# Patient Record
Sex: Female | Born: 1990 | Race: White | Hispanic: No | Marital: Married | Smoking: Former smoker
Health system: Southern US, Community
[De-identification: ages and names within clinical notes are randomized; demographics above are authoritative.]

## PROBLEM LIST (undated history)

## (undated) DIAGNOSIS — F419 Anxiety disorder, unspecified: Secondary | ICD-10-CM

## (undated) DIAGNOSIS — J45909 Unspecified asthma, uncomplicated: Secondary | ICD-10-CM

## (undated) DIAGNOSIS — T7840XA Allergy, unspecified, initial encounter: Secondary | ICD-10-CM

## (undated) HISTORY — PX: TONSILLECTOMY: SUR1361

## (undated) HISTORY — DX: Anxiety disorder, unspecified: F41.9

## (undated) HISTORY — PX: OTHER SURGICAL HISTORY: SHX169

## (undated) HISTORY — DX: Allergy, unspecified, initial encounter: T78.40XA

## (undated) HISTORY — PX: TYMPANOSTOMY TUBE PLACEMENT: SHX32

## (undated) HISTORY — DX: Unspecified asthma, uncomplicated: J45.909

---
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
## 2017-07-01 ENCOUNTER — Emergency Department
Admission: EM | Admit: 2017-07-01 | Discharge: 2017-07-01 | Disposition: A | Discharge status: Home / Self Care | Payer: BLUE CROSS/BLUE SHIELD | Attending: Emergency Medicine | Admitting: Emergency Medicine

## 2017-07-01 ENCOUNTER — Emergency Department: Payer: BLUE CROSS/BLUE SHIELD

## 2017-07-01 ENCOUNTER — Other Ambulatory Visit: Payer: Self-pay

## 2017-07-01 DIAGNOSIS — F17200 Nicotine dependence, unspecified, uncomplicated: Secondary | ICD-10-CM | POA: Diagnosis not present

## 2017-07-01 DIAGNOSIS — R109 Unspecified abdominal pain: Secondary | ICD-10-CM

## 2017-07-01 DIAGNOSIS — R1011 Right upper quadrant pain: Secondary | ICD-10-CM | POA: Insufficient documentation

## 2017-07-01 LAB — URINALYSIS, COMPLETE (UACMP) WITH MICROSCOPIC
Bacteria, UA: NONE SEEN
Bilirubin Urine: NEGATIVE
GLUCOSE, UA: NEGATIVE mg/dL
Hgb urine dipstick: NEGATIVE
KETONES UR: NEGATIVE mg/dL
Leukocytes, UA: NEGATIVE
Nitrite: NEGATIVE
PROTEIN: NEGATIVE mg/dL
Specific Gravity, Urine: 1.021 (ref 1.005–1.030)
pH: 5 (ref 5.0–8.0)

## 2017-07-01 LAB — COMPREHENSIVE METABOLIC PANEL
ALBUMIN: 4.2 g/dL (ref 3.5–5.0)
ALK PHOS: 91 U/L (ref 38–126)
ALT: 26 U/L (ref 14–54)
ANION GAP: 9 (ref 5–15)
AST: 23 U/L (ref 15–41)
BUN: 13 mg/dL (ref 6–20)
CALCIUM: 9.5 mg/dL (ref 8.9–10.3)
CHLORIDE: 105 mmol/L (ref 101–111)
CO2: 25 mmol/L (ref 22–32)
CREATININE: 0.81 mg/dL (ref 0.44–1.00)
GFR calc non Af Amer: >60 mL/min (ref >60)
GLUCOSE: 113 mg/dL — AB (ref 65–99)
Potassium: 3.5 mmol/L (ref 3.5–5.1)
SODIUM: 139 mmol/L (ref 135–145)
Total Bilirubin: 0.4 mg/dL (ref 0.3–1.2)
Total Protein: 8.1 g/dL (ref 6.5–8.1)

## 2017-07-01 LAB — CBC
HCT: 43.8 % (ref 35.0–47.0)
HEMOGLOBIN: 14.8 g/dL (ref 12.0–16.0)
MCH: 27.8 pg (ref 26.0–34.0)
MCHC: 33.8 g/dL (ref 32.0–36.0)
MCV: 82.3 fL (ref 80.0–100.0)
Platelets: 442 10*3/uL — ABNORMAL HIGH (ref 150–440)
RBC: 5.32 MIL/uL — AB (ref 3.80–5.20)
RDW: 13.7 % (ref 11.5–14.5)
WBC: 13.1 10*3/uL — ABNORMAL HIGH (ref 3.6–11.0)

## 2017-07-01 LAB — POCT PREGNANCY, URINE: PREG TEST UR: NEGATIVE

## 2017-07-01 LAB — LIPASE, BLOOD: LIPASE: 22 U/L (ref 11–51)

## 2017-07-01 MED ORDER — DICYCLOMINE HCL 20 MG PO TABS: 20.0000 mg | ORAL_TABLET | Freq: Three times a day (TID) | ORAL | 0 refills | Status: DC | PRN

## 2017-07-01 MED ORDER — ONDANSETRON 4 MG PO TBDP: 4.0000 mg | ORAL_TABLET | Freq: Three times a day (TID) | ORAL | 0 refills | Status: DC | PRN

## 2017-07-01 NOTE — ED Notes (Signed)
Patient transported to CT 

## 2017-07-01 NOTE — ED Provider Notes (Signed)
[facility] Emergency Department Provider Note       Time seen: ----------------------------------------- 8:41 PM on 07/01/2017 -----------------------------------------   I have reviewed the triage vital signs and the nursing notes.  HISTORY   Chief Complaint Abdominal Pain    HPI Evelyn Rangel is a 26 y.o. female with no significant past medical history who presents to the ED for right-sided abdominal pain that started today.  Patient does report some nausea but denies vomiting or diarrhea.  She reports the pain occasionally radiates into the right upper quadrant.  She denies fevers, chills, chest pain, shortness of breath or dysuria.  History reviewed. No pertinent past medical history.  There are no active problems to display for this patient.   Past Surgical History:  Procedure Laterality Date  . lymph node removal    . TONSILLECTOMY    . TYMPANOSTOMY TUBE PLACEMENT      Allergies Azithromycin  Social History Social History   Tobacco Use  . Smoking status: Current Some Day Smoker  . Smokeless tobacco: Never Used  Substance Use Topics  . Alcohol use: Not on file  . Drug use: Not on file    Review of Systems Constitutional: Negative for fever. Cardiovascular: Negative for chest pain. Respiratory: Negative for shortness of breath. Gastrointestinal: positive for abdominal pain Genitourinary: Negative for dysuria. Musculoskeletal: Negative for back pain. Skin: Negative for rash. Neurological: Negative for headaches, focal weakness or numbness.  All systems negative/normal/unremarkable except as stated in the HPI  ____________________________________________   PHYSICAL EXAM:  VITAL SIGNS: ED Triage Vitals  Enc Vitals Group     BP 07/01/17 2005 (!) 142/86     Pulse Rate 07/01/17 2005 (!) 101     Resp 07/01/17 2005 16     Temp 07/01/17 2005 98.6 F (37 C)     Temp Source 07/01/17 2005 Oral     SpO2 07/01/17 2005 97 %   Weight 07/01/17 2000 198 lb (89.8 kg)     Height 07/01/17 2000 5\' 5"  (1.651 m)     Head Circumference --      Peak Flow --      Pain Score 07/01/17 2000 7     Pain Loc --      Pain Edu? --      Excl. in GC? --     Constitutional: Alert and oriented. Well appearing and in no distress. Eyes: Conjunctivae are normal. Normal extraocular movements. ENT   Head: Normocephalic and atraumatic.   Nose: No congestion/rhinnorhea.   Mouth/Throat: Mucous membranes are moist.   Neck: No stridor. Cardiovascular: Normal rate, regular rhythm. No murmurs, rubs, or gallops. Respiratory: Normal respiratory effort without tachypnea nor retractions. Breath sounds are clear and equal bilaterally. No wheezes/rales/rhonchi. Gastrointestinal: Nonfocal right-sided abdominal tenderness, no rebound or guarding.  Normal bowel sounds. Musculoskeletal: Nontender with normal range of motion in extremities. No lower extremity tenderness nor edema. Neurologic:  Normal speech and language. No gross focal neurologic deficits are appreciated.  Skin:  Skin is warm, dry and intact. No rash noted. Psychiatric: Mood and affect are normal. Speech and behavior are normal.  ____________________________________________  ED COURSE:  Pertinent labs & imaging results that were available during my care of the patient were reviewed by me and considered in my medical decision making (see chart for details). Patient presents for abdominal pain, we will assess with labs and imaging as indicated.   Procedures ____________________________________________   LABS (pertinent positives/negatives)  Labs Reviewed  COMPREHENSIVE METABOLIC PANEL -  Abnormal; Notable for the following components:      Result Value   Glucose, Bld 113 (*)    All other components within normal limits  CBC - Abnormal; Notable for the following components:   WBC 13.1 (*)    RBC 5.32 (*)    Platelets 442 (*)    All other components within normal  limits  URINALYSIS, COMPLETE (UACMP) WITH MICROSCOPIC - Abnormal; Notable for the following components:   Color, Urine YELLOW (*)    APPearance CLEAR (*)    Squamous Epithelial / LPF 0-5 (*)    All other components within normal limits  CHLAMYDIA/NGC RT PCR ([facility] ONLY)  LIPASE, BLOOD  POC URINE PREG, ED  POCT PREGNANCY, URINE    RADIOLOGY Images were viewed by me  CT renal protocol IMPRESSION: 1. No acute bowel inflammation or obstruction. Normal appendix. 2. No obstructive uropathy. 3. Moderate disc flattening with posterior marginal osteophytes L5-S1. ____________________________________________  DIFFERENTIAL DIAGNOSIS   Cholecystitis, biliary colic, renal colic, appendicitis, gas pain, colitis  FINAL ASSESSMENT AND PLAN  Flank pain   Plan: Patient had presented for right-sided abdominal pain. Patient's labs revealed slightly elevated white blood cell count but otherwise normal. Patient's imaging was negative for acute intra-abdominal process.  On reexamination she has minimal tenderness.  It is unclear as to the source of her pain at this point.  It seems to be right mid quadrant, I have advised her to come back here in the next 1-2 days for recheck if not improving.   [name], Jaslynn Thome E, MD   Note: This note was generated in part or whole with voice recognition software. Voice recognition is usually quite accurate but there are transcription errors that can and very often do occur. I apologize for any typographical errors that were not detected and corrected.     [name], Jahmir Salo E, MD 07/01/17 2211

## 2017-07-01 NOTE — ED Notes (Signed)
Pt has not been able to produce urine yet. Pt denies noting blood in urine. Will further assess urine upon collection of sample.

## 2017-07-01 NOTE — ED Triage Notes (Signed)
Pt presents to ED via POV from home with c/o RLQ pain that started today. Pt reports (+) nausea, but denies vomiting or diarrhea. Pt reports pain occassionaly radiates into the RUQ. Pt denies CP, SHOB; no changes in urinary habits or frequency. Pt is A&O, in NAD, RR even, regular, and unlabored. Pt denies every having Gall Bladder or Appendix removal, but reports significant family h/x of same.

## 2017-07-01 NOTE — ED Notes (Signed)
MD at bedside. 

## 2017-11-09 LAB — RESULTS CONSOLE HPV: CHL HPV: NEGATIVE

## 2017-11-09 LAB — HM PAP SMEAR: HM Pap smear: NEGATIVE

## 2019-06-09 LAB — TSH: TSH: 2.8 (ref 0.41–5.90)

## 2019-06-09 LAB — VITAMIN B12: Vitamin B-12: 203

## 2019-06-09 LAB — CBC AND DIFFERENTIAL
Hemoglobin: 13.7 (ref 12.0–16.0)
WBC: 8.6

## 2019-06-09 LAB — HEMOGLOBIN A1C: Hemoglobin A1C: 5.8

## 2019-06-09 LAB — VITAMIN D 25 HYDROXY (VIT D DEFICIENCY, FRACTURES): Vit D, 25-Hydroxy: 31

## 2019-06-14 ENCOUNTER — Encounter: Payer: Self-pay | Admitting: Internal Medicine

## 2019-06-14 ENCOUNTER — Other Ambulatory Visit: Payer: Self-pay

## 2019-06-14 ENCOUNTER — Ambulatory Visit (INDEPENDENT_AMBULATORY_CARE_PROVIDER_SITE_OTHER): Payer: 59 | Admitting: Internal Medicine

## 2019-06-14 VITALS — BP 112/70 | HR 104 | Ht 65.0 in | Wt 190.0 lb

## 2019-06-14 DIAGNOSIS — M7542 Impingement syndrome of left shoulder: Secondary | ICD-10-CM

## 2019-06-14 DIAGNOSIS — Z23 Encounter for immunization: Secondary | ICD-10-CM

## 2019-06-14 DIAGNOSIS — E538 Deficiency of other specified B group vitamins: Secondary | ICD-10-CM | POA: Diagnosis not present

## 2019-06-14 DIAGNOSIS — J4520 Mild intermittent asthma, uncomplicated: Secondary | ICD-10-CM | POA: Diagnosis not present

## 2019-06-14 DIAGNOSIS — R7303 Prediabetes: Secondary | ICD-10-CM | POA: Diagnosis not present

## 2019-06-14 DIAGNOSIS — F17200 Nicotine dependence, unspecified, uncomplicated: Secondary | ICD-10-CM

## 2019-06-14 DIAGNOSIS — E559 Vitamin D deficiency, unspecified: Secondary | ICD-10-CM

## 2019-06-14 NOTE — Progress Notes (Signed)
Date:  06/14/2019   Name:  Evelyn Rangel   DOB:  10/01/1990   MRN:  [id number]   Chief Complaint: Establish Care (Flu shot. ) and Prediabetic (5.8 A1C 06/09/2019. )  Asthma There is no cough, shortness of breath or wheezing. This is a recurrent problem. The problem occurs rarely. Pertinent negatives include no chest pain or headaches. Her symptoms are aggravated by pollen. Her symptoms are alleviated by beta-agonist. Her past medical history is significant for asthma.  Diabetes She presents for her follow-up diabetic visit. Diabetes type: pre-diabetes. Her disease course has been stable (first noted 6 months ago). Pertinent negatives for hypoglycemia include no dizziness or headaches. Pertinent negatives for diabetes include no chest pain, no fatigue, no polydipsia, no polyuria and no visual change. Symptoms are stable. Current diabetic treatment includes diet (and weight loss). She is compliant with treatment most of the time. Her weight is decreasing steadily. Diabetic current diet: low carb diet. An ACE inhibitor/angiotensin II receptor blocker is not being taken.    Lab Results  Component Value Date   CREATININE 0.81 07/01/2017   BUN 13 07/01/2017   NA 139 07/01/2017   K 3.5 07/01/2017   CL 105 07/01/2017   CO2 25 07/01/2017   No results found for: CHOL, HDL, LDLCALC, LDLDIRECT, TRIG, CHOLHDL Lab Results  Component Value Date   TSH 2.80 06/09/2019   Lab Results  Component Value Date   HGBA1C 5.8 06/09/2019   Lab Results  Component Value Date   VITAMINB12 203 06/09/2019   Vitamin D = 31   Review of Systems  Constitutional: Negative for chills, fatigue and unexpected weight change.  Respiratory: Negative for cough, chest tightness, shortness of breath and wheezing.   Cardiovascular: Negative for chest pain and palpitations.  Endocrine: Negative for polydipsia and polyuria.  Neurological: Negative for dizziness, light-headedness and headaches.    Patient Active Problem  List   Diagnosis Date Noted  . Mild intermittent asthma without complication 06/14/2019  . Prediabetes 06/14/2019  . Vitamin D deficiency 06/14/2019  . Vitamin B12 deficiency 06/14/2019    Allergies  Allergen Reactions  . Azithromycin Hives and Rash    Past Surgical History:  Procedure Laterality Date  . lymph node removal    . TONSILECTOMY/ADENOIDECTOMY WITH MYRINGOTOMY  2012  . TONSILLECTOMY    . TYMPANOSTOMY TUBE PLACEMENT    . WISDOM TOOTH EXTRACTION  2009    Social History   Tobacco Use  . Smoking status: Current Every Day Smoker    Packs/day: 0.50    Years: 6.00    Pack years: 3.00  . Smokeless tobacco: Never Used  Substance Use Topics  . Alcohol use: Yes    Alcohol/week: 1.0 standard drinks    Types: 1 Standard drinks or equivalent per week  . Drug use: Not Currently    Types: Marijuana     Medication list has been reviewed and updated.  Current Meds  Medication Sig  . albuterol (VENTOLIN HFA) 108 (90 Base) MCG/ACT inhaler Inhale into the lungs every 6 (six) hours as needed for wheezing or shortness of breath.  . vitamin B-12 (CYANOCOBALAMIN) 1000 MCG tablet Take 1,000 mcg by mouth daily.  . Vitamin D, Ergocalciferol, (DRISDOL) 1.25 MG (50000 UT) CAPS capsule Take 50,000 Units by mouth once a week.    PHQ 2/9 Scores 06/14/2019  PHQ - 2 Score 0    BP Readings from Last 3 Encounters:  06/14/19 112/70  07/01/17 119/86    Physical Exam  Vitals signs and nursing note reviewed.  Constitutional:      General: She is not in acute distress.    Appearance: Normal appearance. She is well-developed.  HENT:     Head: Normocephalic and atraumatic.  Neck:     Musculoskeletal: Normal range of motion and neck supple.  Cardiovascular:     Rate and Rhythm: Normal rate and regular rhythm.     Pulses: Normal pulses.     Heart sounds: No murmur.  Pulmonary:     Effort: Pulmonary effort is normal. No respiratory distress.  Musculoskeletal: Normal range of  motion.     Right lower leg: No edema.     Left lower leg: No edema.  Lymphadenopathy:     Cervical: No cervical adenopathy.  Skin:    General: Skin is warm and dry.     Capillary Refill: Capillary refill takes less than 2 seconds.     Findings: No rash.  Neurological:     General: No focal deficit present.     Mental Status: She is alert and oriented to person, place, and time.  Psychiatric:        Behavior: Behavior normal.        Thought Content: Thought content normal.     Wt Readings from Last 3 Encounters:  06/14/19 190 lb (86.2 kg)  07/01/17 198 lb (89.8 kg)    BP 112/70   Pulse (!) 104   Ht 5\' 5"  (1.651 m)   Wt 190 lb (86.2 kg)   LMP 06/10/2019   SpO2 97%   BMI 31.62 kg/m   Assessment and Plan: 1. Mild intermittent asthma without complication Stable mild sx triggered by seasonal changes/pollen Continue Albuterol MDI as needed  2. Prediabetes A1C has been stable for the past few months She is losing weight with diet and exercise (about 15 lbs since June) Will continue the same path and recheck again with lipids in 4 months  3. Vitamin B12 deficiency Now on oral supplements - if levels done increase, will need to discuss monthly injections  4. Vitamin D deficiency Now on high dose weekly supplementation  5. Tobacco use disorder Pt is working to quit on her own by slowly reducing her use She is not interested in medication to assist at this time  6. Impingement syndrome of left shoulder MRI pending - being followed by Orthopedics   Partially dictated using Editor, commissioning. Any errors are unintentional.  Halina Maidens, MD [facility]  06/14/2019

## 2019-10-13 ENCOUNTER — Encounter: Payer: Self-pay | Admitting: Internal Medicine

## 2019-10-13 ENCOUNTER — Other Ambulatory Visit: Payer: Self-pay

## 2019-10-13 ENCOUNTER — Ambulatory Visit (INDEPENDENT_AMBULATORY_CARE_PROVIDER_SITE_OTHER): Payer: 59 | Admitting: Internal Medicine

## 2019-10-13 VITALS — BP 106/76 | HR 109 | Temp 98.5°F | Ht 65.0 in | Wt 192.0 lb

## 2019-10-13 DIAGNOSIS — E538 Deficiency of other specified B group vitamins: Secondary | ICD-10-CM | POA: Diagnosis not present

## 2019-10-13 DIAGNOSIS — F321 Major depressive disorder, single episode, moderate: Secondary | ICD-10-CM

## 2019-10-13 DIAGNOSIS — E559 Vitamin D deficiency, unspecified: Secondary | ICD-10-CM | POA: Diagnosis not present

## 2019-10-13 DIAGNOSIS — R7303 Prediabetes: Secondary | ICD-10-CM

## 2019-10-13 NOTE — Progress Notes (Signed)
Date:  10/13/2019   Name:  Evelyn Rangel   DOB:  07/22/1991   MRN:  [id number]   Chief Complaint: Pre- DM (Follow up.) and Hyperlipidemia (Check.) Currently trying to become pregnant.  Being followed by GYN. She does continue to smoke about 1/2 ppd but plans to try again to quit. Diabetes She presents for her follow-up diabetic visit. Diabetes type: pre-diabetes. Her disease course has been stable. Pertinent negatives for hypoglycemia include no headaches or tremors. Pertinent negatives for diabetes include no chest pain, no fatigue, no polydipsia and no polyuria. There are no diabetic complications. Current diabetic treatment includes diet. She is compliant with treatment most of the time.  Hyperlipidemia Exacerbating diseases include diabetes (prediabetes). Pertinent negatives include no chest pain or shortness of breath. Current antihyperlipidemic treatment includes diet change and exercise.  Depression        This is a new problem.  The problem has been gradually improving since onset.  Associated symptoms include no fatigue and no headaches.  Past treatments include SSRIs - Selective serotonin reuptake inhibitors and other medications (zoloft and elavil for sleep).  Compliance with treatment is good.  Previous treatment provided moderate relief. Vitamin D def - previously on high dose weekly but stopped after several months and started taking a multi vit with D. Vitamin B12 def - previously on oral B12; GYN wanted her on injections but she decided to try oral supplements with a multivit.  Lab Results  Component Value Date   CREATININE 0.81 07/01/2017   BUN 13 07/01/2017   NA 139 07/01/2017   K 3.5 07/01/2017   CL 105 07/01/2017   CO2 25 07/01/2017   No results found for: CHOL, HDL, LDLCALC, LDLDIRECT, TRIG, CHOLHDL Lab Results  Component Value Date   TSH 2.80 06/09/2019   Lab Results  Component Value Date   HGBA1C 5.8 06/09/2019   Lab Results  Component Value Date   WBC 8.6  06/09/2019   HGB 13.7 06/09/2019   HCT 43.8 07/01/2017   MCV 82.3 07/01/2017   PLT 442 (H) 07/01/2017   Lab Results  Component Value Date   ALT 26 07/01/2017   AST 23 07/01/2017   ALKPHOS 91 07/01/2017   BILITOT 0.4 07/01/2017     Review of Systems  Constitutional: Negative for chills, fatigue, fever and unexpected weight change.  HENT: Negative for tinnitus and trouble swallowing.   Eyes: Negative for visual disturbance.  Respiratory: Negative for cough, chest tightness and shortness of breath.   Cardiovascular: Negative for chest pain, palpitations and leg swelling.  Gastrointestinal: Negative for abdominal pain.  Endocrine: Negative for polydipsia and polyuria.  Genitourinary: Negative for menstrual problem.  Musculoskeletal: Negative for arthralgias.  Neurological: Negative for tremors, numbness and headaches.  Psychiatric/Behavioral: Positive for depression. Negative for dysphoric mood.    Patient Active Problem List   Diagnosis Date Noted  . Mild intermittent asthma without complication 74/02/1447  . Prediabetes 06/14/2019  . Vitamin D deficiency 06/14/2019  . Vitamin B12 deficiency 06/14/2019  . Tobacco use disorder 06/14/2019  . Impingement syndrome of left shoulder 06/14/2019    Allergies  Allergen Reactions  . Azithromycin Hives and Rash    Past Surgical History:  Procedure Laterality Date  . lymph node removal    . TONSILECTOMY/ADENOIDECTOMY WITH MYRINGOTOMY  2012  . TONSILLECTOMY    . TYMPANOSTOMY TUBE PLACEMENT    . WISDOM TOOTH EXTRACTION  2009    Social History   Tobacco Use  . Smoking status:  Current Every Day Smoker    Packs/day: 0.50    Years: 6.00    Pack years: 3.00  . Smokeless tobacco: Never Used  Substance Use Topics  . Alcohol use: Yes    Alcohol/week: 1.0 standard drinks    Types: 1 Standard drinks or equivalent per week  . Drug use: Not Currently    Types: Marijuana     Medication list has been reviewed and  updated.  Current Meds  Medication Sig  . albuterol (VENTOLIN HFA) 108 (90 Base) MCG/ACT inhaler Inhale into the lungs every 6 (six) hours as needed for wheezing or shortness of breath.  Marland Kitchen amitriptyline (ELAVIL) 25 MG tablet Take 25 mg by mouth at bedtime.  . Multiple Vitamins-Minerals (MULTIVITAMIN WITH MINERALS) tablet Take 1 tablet by mouth daily.  . sertraline (ZOLOFT) 25 MG tablet Take 25 mg by mouth daily. Dr. Billy Coast  . [DISCONTINUED] cyanocobalamin (,VITAMIN B-12,) 1000 MCG/ML injection Inject into the muscle.    PHQ 2/9 Scores 10/13/2019 06/14/2019  PHQ - 2 Score 0 0  PHQ- 9 Score 0 -    BP Readings from Last 3 Encounters:  10/13/19 106/76  06/14/19 112/70  07/01/17 119/86    Physical Exam Vitals and nursing note reviewed.  Constitutional:      General: She is not in acute distress.    Appearance: Normal appearance. She is well-developed.  HENT:     Head: Normocephalic and atraumatic.  Neck:     Vascular: No carotid bruit.  Cardiovascular:     Rate and Rhythm: Normal rate and regular rhythm.     Pulses: Normal pulses.  Pulmonary:     Effort: Pulmonary effort is normal. No respiratory distress.     Breath sounds: No wheezing or rhonchi.  Musculoskeletal:     Cervical back: Normal range of motion.     Right lower leg: No edema.     Left lower leg: No edema.  Lymphadenopathy:     Cervical: No cervical adenopathy.  Skin:    General: Skin is warm and dry.     Findings: No rash.  Neurological:     General: No focal deficit present.     Mental Status: She is alert and oriented to person, place, and time.  Psychiatric:        Mood and Affect: Mood normal.        Behavior: Behavior normal.     Wt Readings from Last 3 Encounters:  10/13/19 192 lb (87.1 kg)  06/14/19 190 lb (86.2 kg)  07/01/17 198 lb (89.8 kg)    BP 106/76   Pulse (!) 109   Temp 98.5 F (36.9 C) (Oral)   Ht 5\' 5"  (1.651 m)   Wt 192 lb (87.1 kg)   LMP 10/11/2019 (Exact Date)    SpO2 97%   BMI 31.95 kg/m   Assessment and Plan: 1. Prediabetes Continue diet changes, regular exercise Check lipid panel - Hemoglobin A1c - Lipid panel  2. Vitamin D deficiency Check levels and advise if additional oral supplement is needed - VITAMIN D 25 Hydroxy (Vit-D Deficiency, Fractures)  3. Vitamin B12 deficiency Check levels and advise - Vitamin B12  4. Current moderate 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 prior episode (HCC) Seeing CBC in 10/13/2019 Just started on Zoloft and Elavil   Partially dictated using [place]. Any errors are unintentional.  Animal nutritionist, MD [facility]  10/13/2019

## 2019-10-14 LAB — HEMOGLOBIN A1C
Est. average glucose Bld gHb Est-mCnc: 111 mg/dL
Hgb A1c MFr Bld: 5.5 % (ref 4.8–5.6)

## 2019-10-14 LAB — LIPID PANEL
Chol/HDL Ratio: 4.9 ratio — ABNORMAL HIGH (ref 0.0–4.4)
Cholesterol, Total: 216 mg/dL — ABNORMAL HIGH (ref 100–199)
HDL: 44 mg/dL (ref >39)
LDL Chol Calc (NIH): 149 mg/dL — ABNORMAL HIGH (ref 0–99)
Triglycerides: 125 mg/dL (ref 0–149)
VLDL Cholesterol Cal: 23 mg/dL (ref 5–40)

## 2019-10-14 LAB — VITAMIN D 25 HYDROXY (VIT D DEFICIENCY, FRACTURES): Vit D, 25-Hydroxy: 32.7 ng/mL (ref 30.0–100.0)

## 2019-10-14 LAB — VITAMIN B12: Vitamin B-12: 1768 pg/mL — ABNORMAL HIGH (ref 232–1245)

## 2019-12-01 ENCOUNTER — Ambulatory Visit
Admission: EM | Admit: 2019-12-01 | Discharge: 2019-12-01 | Disposition: A | Discharge status: Home / Self Care | Payer: 59 | Attending: Family Medicine | Admitting: Family Medicine

## 2019-12-01 ENCOUNTER — Other Ambulatory Visit: Payer: Self-pay

## 2019-12-01 ENCOUNTER — Ambulatory Visit (INDEPENDENT_AMBULATORY_CARE_PROVIDER_SITE_OTHER)
Admit: 2019-12-01 | Discharge: 2019-12-01 | Disposition: A | Discharge status: Home / Self Care | Payer: 59 | Attending: Family Medicine | Admitting: Family Medicine

## 2019-12-01 ENCOUNTER — Encounter: Payer: Self-pay | Admitting: Emergency Medicine

## 2019-12-01 ENCOUNTER — Ambulatory Visit: Payer: Self-pay | Admitting: *Deleted

## 2019-12-01 DIAGNOSIS — R1011 Right upper quadrant pain: Secondary | ICD-10-CM | POA: Insufficient documentation

## 2019-12-01 DIAGNOSIS — K5900 Constipation, unspecified: Secondary | ICD-10-CM | POA: Diagnosis not present

## 2019-12-01 LAB — COMPREHENSIVE METABOLIC PANEL
ALT: 32 U/L (ref 0–44)
AST: 26 U/L (ref 15–41)
Albumin: 4.2 g/dL (ref 3.5–5.0)
Alkaline Phosphatase: 76 U/L (ref 38–126)
Anion gap: 10 (ref 5–15)
BUN: 12 mg/dL (ref 6–20)
CO2: 26 mmol/L (ref 22–32)
Calcium: 9.4 mg/dL (ref 8.9–10.3)
Chloride: 101 mmol/L (ref 98–111)
Creatinine, Ser: 0.82 mg/dL (ref 0.44–1.00)
GFR calc Af Amer: >60 mL/min (ref >60)
GFR calc non Af Amer: >60 mL/min (ref >60)
Glucose, Bld: 89 mg/dL (ref 70–99)
Potassium: 4 mmol/L (ref 3.5–5.1)
Sodium: 137 mmol/L (ref 135–145)
Total Bilirubin: 0.5 mg/dL (ref 0.3–1.2)
Total Protein: 8.2 g/dL — ABNORMAL HIGH (ref 6.5–8.1)

## 2019-12-01 LAB — CBC WITH DIFFERENTIAL/PLATELET
Abs Immature Granulocytes: 0.03 10*3/uL (ref 0.00–0.07)
Basophils Absolute: 0.1 10*3/uL (ref 0.0–0.1)
Basophils Relative: 1 %
Eosinophils Absolute: 0.4 10*3/uL (ref 0.0–0.5)
Eosinophils Relative: 4 %
HCT: 41.7 % (ref 36.0–46.0)
Hemoglobin: 14 g/dL (ref 12.0–15.0)
Immature Granulocytes: 0 %
Lymphocytes Relative: 38 %
Lymphs Abs: 3.6 10*3/uL (ref 0.7–4.0)
MCH: 28.3 pg (ref 26.0–34.0)
MCHC: 33.6 g/dL (ref 30.0–36.0)
MCV: 84.4 fL (ref 80.0–100.0)
Monocytes Absolute: 0.7 10*3/uL (ref 0.1–1.0)
Monocytes Relative: 8 %
Neutro Abs: 4.8 10*3/uL (ref 1.7–7.7)
Neutrophils Relative %: 49 %
Platelets: 432 10*3/uL — ABNORMAL HIGH (ref 150–400)
RBC: 4.94 MIL/uL (ref 3.87–5.11)
RDW: 13.2 % (ref 11.5–15.5)
WBC: 9.7 10*3/uL (ref 4.0–10.5)
nRBC: 0 % (ref 0.0–0.2)

## 2019-12-01 LAB — LIPASE, BLOOD: Lipase: 20 U/L (ref 11–51)

## 2019-12-01 NOTE — Telephone Encounter (Signed)
Summary: adominal pain    Patient requesting to speak with nurse to discuss abdominal pain in the upper right quadrant. She also states that she feels a fullness in her abdomen like she has to use the restroom constantly.      Patient states she started having pain in abdomen 1 week ago- has not improved. URQ- constant mild pain- increased bowel symptoms- pressure to move bowels constant. No appointment at office- advised UC per protocol. Reason for Disposition . [1] MILD-MODERATE pain AND [2] not relieved by antacids  Answer Assessment - Initial Assessment Questions 1. LOCATION: "Where does it hurt?"      Under R breast- under rib cage 2. RADIATION: "Does the pain shoot anywhere else?" (e.g., chest, back)     Shoulder blade- not all the time 3. ONSET: "When did the pain begin?" (e.g., minutes, hours or days ago)      Monday night 4. SUDDEN: "Gradual or sudden onset?"     sudden 5. PATTERN "Does the pain come and go, or is it constant?"    - If constant: "Is it getting better, staying the same, or worsening?"      (Note: Constant means the pain never goes away completely; most serious pain is constant and it progresses)     - If intermittent: "How long does it last?" "Do you have pain now?"     (Note: Intermittent means the pain goes away completely between bouts)     constant 6. SEVERITY: "How bad is the pain?"  (e.g., Scale 1-10; mild, moderate, or severe)   - MILD (1-3): doesn't interfere with normal activities, abdomen soft and not tender to touch    - MODERATE (4-7): interferes with normal activities or awakens from sleep, tender to touch    - SEVERE (8-10): excruciating pain, doubled over, unable to do any normal activities      Mild- 2 7. RECURRENT SYMPTOM: "Have you ever had this type of abdominal pain before?" If so, ask: "When was the last time?" and "What happened that time?"      no 8. CAUSE: "What do you think is causing the abdominal pain?"     no 9.  RELIEVING/AGGRAVATING FACTORS: "What makes it better or worse?" (e.g., movement, antacids, bowel movement)     No 10. OTHER SYMPTOMS: "Has there been any vomiting, diarrhea, constipation, or urine problems?"       2 weeks ago- nausea- vomiting- 1 episode, this week- increased stools- constant pressure-increased gas 11. PREGNANCY: "Is there any chance you are pregnant?" "When was your last menstrual period?"       No- LMP- due in 1 week  Protocols used: ABDOMINAL PAIN - UPPER-A-AH, ABDOMINAL PAIN - [facility]

## 2019-12-01 NOTE — Discharge Instructions (Addendum)
Miralax daily.  [place] and labs normal.  If persists, see Dr. Asencion Partridge. If worsens, go to the ER.  Take care  Dr. Adriana Simas

## 2019-12-01 NOTE — ED Provider Notes (Signed)
[facility]    CSN: [id number] Arrival date & time: 12/01/19  1431  History   Chief Complaint Chief Complaint  Patient presents with  . Constipation  . Abdominal Pain   HPI  29 year old female presents with the above complaints.  Patient reports 4-day history of right upper quadrant pain.  Reports some nausea.  No vomiting.  Patient reports that she has had ongoing constipation.  Last time she had a bowel movement was on Tuesday.  She is taken over-the-counter medication to try and produce a bowel movement.  She reports decrease in appetite.  No documented fever.  Patient is concerned that she may have a gallbladder issue as it runs in her family particular around this age.  No relieving factors.  No fever.  No other associated symptoms.  No other complaints.  Past Medical History:  Diagnosis Date  . Allergy   . Anxiety   . Asthma    Patient Active Problem List   Diagnosis Date Noted  . 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 Mild intermittent asthma without complication 06/14/2019  . Prediabetes 06/14/2019  . Vitamin D deficiency 06/14/2019  . Vitamin B12 deficiency 06/14/2019  . Tobacco use disorder 06/14/2019  . Impingement syndrome of left shoulder 06/14/2019    Past Surgical History:  Procedure Laterality Date  . lymph node removal    . TONSILECTOMY/ADENOIDECTOMY WITH MYRINGOTOMY  2012  . TONSILLECTOMY    . TYMPANOSTOMY TUBE PLACEMENT    . WISDOM TOOTH EXTRACTION  2009    OB History   No obstetric history on file.      Home Medications    Prior to Admission medications   Medication Sig Start Date End Date Taking? Authorizing Provider  albuterol (VENTOLIN HFA) 108 (90 Base) MCG/ACT inhaler Inhale into the lungs every 6 (six) hours as needed for wheezing or shortness of breath.   Yes [provider]  amitriptyline (ELAVIL) 25 MG tablet Take 25 mg by mouth at bedtime.   Yes [provider]  Multiple Vitamins-Minerals (MULTIVITAMIN WITH MINERALS) tablet Take 1 tablet by mouth daily.   Yes [provider]  sertraline (ZOLOFT) 25 MG tablet Take 25 mg by mouth daily. Dr. Billy Coast   Yes [provider]  vitamin B-12 (CYANOCOBALAMIN) 1000 MCG tablet Take 1,000 mcg by mouth daily.   Yes [provider]  Vitamin D, Ergocalciferol, (DRISDOL) 1.25 MG (50000 UT) CAPS capsule Take 50,000 Units by mouth once a week. 06/10/19   [provider]    Family History Family History  Problem Relation Age of Onset  . Depression Mother   . Mental illness Mother   . Thyroid disease Mother   . Depression Father   . Mental illness Father     Social History Social History   Tobacco Use  . Smoking status: Current Every Day Smoker    Packs/day: 0.50    Years: 6.00    Pack years: 3.00  . Smokeless tobacco: Never Used  Substance Use Topics  . Alcohol use: Yes    Alcohol/week: 1.0 standard drinks    Types: 1 Standard drinks or equivalent per week  . Drug use: Not Currently    Types: Marijuana     Allergies   Azithromycin   Review of Systems Review of Systems  Constitutional: Positive for appetite change. Negative for fever.  Gastrointestinal: Positive for abdominal pain, constipation and nausea. Negative for vomiting.   Physical Exam  Triage Vital Signs ED Triage Vitals  Enc Vitals Group     BP 12/01/19 1452 (!) 131/94     Pulse Rate 12/01/19 1452 (!) 107     Resp 12/01/19 1452 18     Temp 12/01/19 1452 98.4 F (36.9 C)     Temp Source 12/01/19 1452 Oral     SpO2 12/01/19 1452 100 %     Weight 12/01/19 1449 192 lb 0.3 oz (87.1 kg)     Height 12/01/19 1449 5\' 5"  (1.651 m)     Head Circumference --      Peak Flow --      Pain Score 12/01/19 1449 0     Pain Loc --      Pain Edu? --      Excl. in GC? --    Updated Vital Signs BP (!) 131/94 (BP Location: Right Arm)   Pulse (!) 107   Temp 98.4 F (36.9 C) (Oral)    Resp 18   Ht 5\' 5"  (1.651 m)   Wt 87.1 kg   LMP 11/01/2019 (Approximate)   SpO2 100%   BMI 31.95 kg/m   Visual Acuity Right Eye Distance:   Left Eye Distance:   Bilateral Distance:    Right Eye Near:   Left Eye Near:    Bilateral Near:     Physical Exam Vitals and nursing note reviewed.  Constitutional:      General: She is not in acute distress.    Appearance: Normal appearance. She is obese. She is not ill-appearing.  HENT:     Head: Normocephalic and atraumatic.  Eyes:     General:        Right eye: No discharge.        Left eye: No discharge.     Conjunctiva/sclera: Conjunctivae normal.  Cardiovascular:     Rate and Rhythm: Tachycardia present.     Heart sounds: No murmur.  Pulmonary:     Effort: Pulmonary effort is normal.     Breath sounds: Normal breath sounds. No wheezing, rhonchi or rales.  Abdominal:     General: There is no distension.     Palpations: Abdomen is soft.     Comments: RUQ tenderness to palpation.  Neurological:     Mental Status: She is alert.  Psychiatric:        Mood and Affect: Mood normal.        Behavior: Behavior normal.    UC Treatments / Results  Labs (all labs ordered are listed, but only abnormal results are displayed) Labs Reviewed  CBC WITH DIFFERENTIAL/PLATELET - Abnormal; Notable for the following components:      Result Value   Platelets 432 (*)    All other components within normal limits  COMPREHENSIVE METABOLIC PANEL - Abnormal; Notable for the following components:   Total Protein 8.2 (*)    All other components within normal limits  LIPASE, BLOOD    EKG   Radiology Abdomen Limited RUQ  Result Date: 12/01/2019 CLINICAL DATA:  Upper abdominal pain EXAM: ULTRASOUND ABDOMEN LIMITED RIGHT UPPER QUADRANT COMPARISON:  None. FINDINGS: Gallbladder: No gallstones or wall thickening visualized. There is no pericholecystic fluid. No sonographic Murphy sign noted by sonographer. Common bile duct: Diameter: 4 mm. No  intrahepatic or extrahepatic biliary duct dilatation. Liver: No focal lesion identified. Within normal limits in parenchymal echogenicity. Portal vein is patent on color Doppler imaging with normal direction of blood flow towards the liver. Other: None. IMPRESSION: Study within  normal limits. Electronically Signed   By: Lowella Grip III M.D.   On: 12/01/2019 15:52    Procedures Procedures (including critical care time)  Medications Ordered in UC Medications - No data to display  Initial Impression / Assessment and Plan / UC Course  I have reviewed the triage vital signs and the nursing notes.  Pertinent labs & imaging results that were available during my care of the patient were reviewed by me and considered in my medical decision making (see chart for details).    29 year old female presents with right upper quadrant pain and constipation.  Laboratory studies unremarkable.  Right upper quadrant ultrasound negative.  Advised MiraLAX for constipation.  Follow-up with primary care physician.  Final Clinical Impressions(s) / UC Diagnoses   Final diagnoses:  RUQ pain  Constipation, unspecified constipation type     Discharge Instructions     Miralax daily.  [place] and labs normal.  If persists, see Dr. Carolin Coy. If worsens, go to the ER.  Take care  Dr. Lacinda Axon    ED Prescriptions    None     PDMP not reviewed this encounter.   Coral Spikes, [place] 12/01/19 1713

## 2019-12-01 NOTE — ED Triage Notes (Signed)
Pt c/o RUQ pain. Started about 4 days ago. She states that the day it started she had multiple BM through out the night. It was NOT diarrhea, stool looked normal but she has not been able to have BM since. She is concerned about her gall bladder. She states she had nausea last week but none since the pain started.

## 2020-01-25 ENCOUNTER — Telehealth: Payer: Self-pay | Admitting: Internal Medicine

## 2020-01-25 NOTE — Telephone Encounter (Signed)
Copied from CRM [id number]. Topic: General - Other >> Jan 25, 2020  9:59 AM Evelyn Rangel wrote: Reason for CRM: Pt was bit by a dog last night and wanted to speak with nurse about having a tetanus shot/ apt stated the bite wasn't too bad for her to go to ER please advise

## 2020-01-26 ENCOUNTER — Other Ambulatory Visit: Payer: Self-pay

## 2020-01-26 ENCOUNTER — Ambulatory Visit (INDEPENDENT_AMBULATORY_CARE_PROVIDER_SITE_OTHER): Payer: 59

## 2020-01-26 DIAGNOSIS — Z23 Encounter for immunization: Secondary | ICD-10-CM

## 2020-01-26 NOTE — Progress Notes (Addendum)
Pt came in today for a TDAP injection. Said she had her last 10 years ago.  Pt wanted [place] to know she has quit smoking so I added this to her record.   Happened this past Tuesday - 01/24/20. Met someone to meet there dogs to see if she could babysit their dogs for them this weekend. Dog went under the dining room table in their guests home and bit her on the left arm. Bruised- had 2 small puncture wounds that are healing well. No infection complaints.  Received her TDAP today in [facility] 1- sitting in the chair.   CM

## 2020-04-17 ENCOUNTER — Ambulatory Visit: Payer: 59 | Admitting: Internal Medicine

## 2020-07-09 ENCOUNTER — Encounter: Payer: Self-pay | Admitting: Internal Medicine

## 2020-07-09 ENCOUNTER — Ambulatory Visit: Payer: 59 | Admitting: Internal Medicine

## 2020-07-09 ENCOUNTER — Other Ambulatory Visit: Payer: Self-pay

## 2020-07-09 VITALS — BP 112/78 | HR 105 | Temp 98.6°F | Ht 65.0 in | Wt 201.0 lb

## 2020-07-09 DIAGNOSIS — Z23 Encounter for immunization: Secondary | ICD-10-CM

## 2020-07-09 DIAGNOSIS — N764 Abscess of vulva: Secondary | ICD-10-CM

## 2020-07-09 MED ORDER — AMOXICILLIN-POT CLAVULANATE 875-125 MG PO TABS
1.0000 | ORAL_TABLET | Freq: Two times a day (BID) | ORAL | 0 refills | Status: AC
Start: 2020-07-09 — End: 2020-07-16

## 2020-07-09 NOTE — Progress Notes (Signed)
Date:  07/09/2020   Name:  Evelyn Rangel   DOB:  1991-01-02   MRN:  [id number]   Chief Complaint: Mass (Labia X1 week, turned into bump, getting bigger in size, painful, has happened in other spots and they have gone away, buttock and arm pit ) and Flu Vaccine  Genital lesion - last week noted a painful lump on the right side of her labia.  It has gotten larger and more painful over the weekend.   She tried soaking in warm bath with no benefit.   Lab Results  Component Value Date   CREATININE 0.82 12/01/2019   BUN 12 12/01/2019   NA 137 12/01/2019   K 4.0 12/01/2019   CL 101 12/01/2019   CO2 26 12/01/2019   Lab Results  Component Value Date   CHOL 216 (H) 10/13/2019   HDL 44 10/13/2019   LDLCALC 149 (H) 10/13/2019   TRIG 125 10/13/2019   CHOLHDL 4.9 (H) 10/13/2019   Lab Results  Component Value Date   TSH 2.80 06/09/2019   Lab Results  Component Value Date   HGBA1C 5.5 10/13/2019   Lab Results  Component Value Date   WBC 9.7 12/01/2019   HGB 14.0 12/01/2019   HCT 41.7 12/01/2019   MCV 84.4 12/01/2019   PLT 432 (H) 12/01/2019   Lab Results  Component Value Date   ALT 32 12/01/2019   AST 26 12/01/2019   ALKPHOS 76 12/01/2019   BILITOT 0.5 12/01/2019     Review of Systems  Constitutional: Negative for chills and fatigue.  Genitourinary: Positive for genital sores. Negative for dysuria and pelvic pain.    Patient Active Problem List   Diagnosis Date Noted  . 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 Mild intermittent asthma without complication 06/14/2019  . Prediabetes 06/14/2019  . Vitamin D deficiency 06/14/2019  . Vitamin B12 deficiency 06/14/2019  . Tobacco use disorder 06/14/2019  . Impingement syndrome of left shoulder 06/14/2019    Allergies  Allergen Reactions  . Azithromycin Hives and Rash    Past Surgical History:  Procedure Laterality Date  . lymph node removal    .  TONSILECTOMY/ADENOIDECTOMY WITH MYRINGOTOMY  2012  . TONSILLECTOMY    . TYMPANOSTOMY TUBE PLACEMENT    . WISDOM TOOTH EXTRACTION  2009    Social History   Tobacco Use  . Smoking status: Current Every Day Smoker    Packs/day: 0.50    Years: 7.00    Pack years: 3.50    Types: Cigarettes  . Smokeless tobacco: Never Used  Vaping Use  . Vaping Use: Never used  Substance Use Topics  . Alcohol use: Yes    Alcohol/week: 1.0 standard drink    Types: 1 Standard drinks or equivalent per week  . Drug use: Not Currently    Types: Marijuana     Medication list has been reviewed and updated.  Current Meds  Medication Sig  . albuterol (VENTOLIN HFA) 108 (90 Base) MCG/ACT inhaler Inhale into the lungs every 6 (six) hours as needed for wheezing or shortness of breath.  Marland Kitchen amitriptyline (ELAVIL) 25 MG tablet Take 50 mg by mouth at bedtime.  . beclomethasone (QVAR REDIHALER) 80 MCG/ACT inhaler Inhale into the lungs as needed.  . Multiple Vitamins-Minerals (MULTIVITAMIN WITH MINERALS) tablet Take 1 tablet by mouth daily.  . progesterone (PROMETRIUM) 200 MG capsule Take by mouth.  . sertraline (ZOLOFT) 25 MG tablet Take 25 mg by  mouth daily. Dr. Billy Coast    [facility] 2/9 Scores 07/09/2020 10/13/2019 06/14/2019  PHQ - 2 Score 0 0 0  PHQ- 9 Score 0 0 -    GAD 7 : Generalized Anxiety Score 07/09/2020  Nervous, Anxious, on Edge 0  Control/stop worrying 0  Worry too much - different things 0  Trouble relaxing 0  Restless 0  Easily annoyed or irritable 0  Afraid - awful might happen 0  Total GAD 7 Score 0    BP Readings from Last 3 Encounters:  07/09/20 112/78  12/01/19 (!) 131/94  10/13/19 106/76    Physical Exam Vitals and nursing note reviewed.  Constitutional:      General: She is not in acute distress.    Appearance: She is well-developed.  HENT:     Head: Normocephalic and atraumatic.  Pulmonary:     Effort: Pulmonary effort is normal. No respiratory distress.   Genitourinary:      Comments: 1 cm abscess draining blood purulent material - the area is palpated and is soft but tender - no core is appreciated.  A small amount of additional material is expressed. Musculoskeletal:        General: Normal range of motion.  Skin:    General: Skin is warm and dry.     Findings: No rash.  Neurological:     Mental Status: She is alert and oriented to person, place, and time.  Psychiatric:        Mood and Affect: Mood and affect normal.        Behavior: Behavior normal.        Thought Content: Thought content normal.     Wt Readings from Last 3 Encounters:  07/09/20 201 lb (91.2 kg)  12/01/19 192 lb 0.3 oz (87.1 kg)  10/13/19 192 lb (87.1 kg)    BP 112/78   Pulse (!) 105   Temp 98.6 F (37 C) (Oral)   Ht 5\' 5"  (1.651 m)   Wt 201 lb (91.2 kg)   SpO2 99%   BMI 33.45 kg/m   Assessment and Plan: 1. Labial abscess Recommend warm soaks for the next few days until closed Pt had significant relief of discomfort once the lesion drained Follow up if needed - amoxicillin-clavulanate (AUGMENTIN) 875-125 MG tablet; Take 1 tablet by mouth 2 (two) times daily for 7 days.  Dispense: 14 tablet; Refill: 0  2. Need for immunization against influenza - Flu Vaccine QUAD 36+ mos IM   Partially dictated using . Any errors are unintentional.  Animal nutritionist, MD [facility]  07/09/2020

## 2020-07-25 ENCOUNTER — Other Ambulatory Visit: Payer: 59

## 2020-07-25 ENCOUNTER — Other Ambulatory Visit: Payer: Self-pay

## 2020-07-25 DIAGNOSIS — Z20822 Contact with and (suspected) exposure to covid-19: Secondary | ICD-10-CM

## 2020-07-26 LAB — NOVEL CORONAVIRUS, NAA: SARS-CoV-2, NAA: NOT DETECTED

## 2020-07-26 LAB — SARS-COV-2, NAA 2 DAY TAT

## 2020-08-07 ENCOUNTER — Ambulatory Visit: Payer: 59 | Admitting: Internal Medicine

## 2020-08-07 ENCOUNTER — Other Ambulatory Visit: Payer: Self-pay

## 2020-08-07 ENCOUNTER — Encounter: Payer: Self-pay | Admitting: Internal Medicine

## 2020-08-07 VITALS — BP 132/78 | HR 96 | Ht 65.0 in | Wt 200.0 lb

## 2020-08-07 DIAGNOSIS — H6122 Impacted cerumen, left ear: Secondary | ICD-10-CM | POA: Diagnosis not present

## 2020-08-07 DIAGNOSIS — H6690 Otitis media, unspecified, unspecified ear: Secondary | ICD-10-CM | POA: Diagnosis not present

## 2020-08-07 MED ORDER — AMOXICILLIN-POT CLAVULANATE 875-125 MG PO TABS: 1.0000 | ORAL_TABLET | Freq: Two times a day (BID) | ORAL | 0 refills | Status: AC

## 2020-08-07 NOTE — Progress Notes (Signed)
Date:  08/07/2020   Name:  Evelyn Rangel   DOB:  05-04-1991   MRN:  [id number]   Chief Complaint: Ear Pain (Left ear - cannot hear out of it. Was painful but not just cannot hear out of it. Taken mucinex, robitussin, dayquil, and nothing has helped. Throat slightly sore on left eye. Everything came on after booster shot 08/01/2020.)  Ear Fullness  There is pain in the left ear. This is a new problem. The current episode started in the past 7 days. There has been no fever. The pain is mild (and can not hear). Associated symptoms include hearing loss and a sore throat. Pertinent negatives include no diarrhea, ear discharge, headaches or vomiting.    Lab Results  Component Value Date   CREATININE 0.82 12/01/2019   BUN 12 12/01/2019   NA 137 12/01/2019   K 4.0 12/01/2019   CL 101 12/01/2019   CO2 26 12/01/2019   Lab Results  Component Value Date   CHOL 216 (H) 10/13/2019   HDL 44 10/13/2019   LDLCALC 149 (H) 10/13/2019   TRIG 125 10/13/2019   CHOLHDL 4.9 (H) 10/13/2019   Lab Results  Component Value Date   TSH 2.80 06/09/2019   Lab Results  Component Value Date   HGBA1C 5.5 10/13/2019   Lab Results  Component Value Date   WBC 9.7 12/01/2019   HGB 14.0 12/01/2019   HCT 41.7 12/01/2019   MCV 84.4 12/01/2019   PLT 432 (H) 12/01/2019   Lab Results  Component Value Date   ALT 32 12/01/2019   AST 26 12/01/2019   ALKPHOS 76 12/01/2019   BILITOT 0.5 12/01/2019     Review of Systems  Constitutional: Negative for chills, fatigue and fever.  HENT: Positive for congestion, hearing loss and sore throat. Negative for ear discharge and ear pain.   Respiratory: Negative for chest tightness and shortness of breath.   Cardiovascular: Negative for chest pain.  Gastrointestinal: Negative for diarrhea, nausea and vomiting.  Neurological: Negative for dizziness and headaches.    Patient Active Problem List   Diagnosis Date Noted  . Current moderate episode of major depressive  disorder without prior episode (HCC) 10/13/2019  . Mild intermittent asthma without complication 06/14/2019  . Prediabetes 06/14/2019  . Vitamin D deficiency 06/14/2019  . Vitamin B12 deficiency 06/14/2019  . Tobacco use disorder 06/14/2019  . Impingement syndrome of left shoulder 06/14/2019    Allergies  Allergen Reactions  . Azithromycin Hives and Rash    Past Surgical History:  Procedure Laterality Date  . lymph node removal    . TONSILECTOMY/ADENOIDECTOMY WITH MYRINGOTOMY  2012  . TONSILLECTOMY    . TYMPANOSTOMY TUBE PLACEMENT    . WISDOM TOOTH EXTRACTION  2009    Social History   Tobacco Use  . Smoking status: Current Every Day Smoker    Packs/day: 0.50    Years: 7.00    Pack years: 3.50    Types: Cigarettes  . Smokeless tobacco: Never Used  Vaping Use  . Vaping Use: Never used  Substance Use Topics  . Alcohol use: Yes    Alcohol/week: 1.0 standard drink    Types: 1 Standard drinks or equivalent per week  . Drug use: Not Currently    Types: Marijuana     Medication list has been reviewed and updated.  No outpatient medications have been marked as taking for the 08/07/20 encounter (Office Visit) with Reubin Milan, MD.    [facility] 2/9 Scores  08/07/2020 07/09/2020 10/13/2019 06/14/2019  PHQ - 2 Score 0 0 0 0  PHQ- 9 Score 0 0 0 -    GAD 7 : Generalized Anxiety Score 08/07/2020 07/09/2020  Nervous, Anxious, on Edge 0 0  Control/stop worrying 0 0  Worry too much - different things 0 0  Trouble relaxing 0 0  Restless 0 0  Easily annoyed or irritable 0 0  Afraid - awful might happen 0 0  Total GAD 7 Score 0 0  Anxiety Difficulty Not difficult at all -    BP Readings from Last 3 Encounters:  08/07/20 132/78  07/09/20 112/78  12/01/19 (!) 131/94    Physical Exam Constitutional:      Appearance: Normal appearance.  HENT:     Right Ear: Hearing and ear canal normal. Tympanic membrane is erythematous and retracted.     Left Ear: Decreased hearing  noted. There is impacted cerumen.     Nose:     Right Sinus: No maxillary sinus tenderness or frontal sinus tenderness.     Left Sinus: No maxillary sinus tenderness or frontal sinus tenderness.     Mouth/Throat:     Pharynx: Pharyngeal swelling and posterior oropharyngeal erythema present. No oropharyngeal exudate.  Cardiovascular:     Rate and Rhythm: Normal rate and regular rhythm.     Pulses: Normal pulses.  Pulmonary:     Effort: Pulmonary effort is normal.     Breath sounds: No wheezing or rhonchi.  Musculoskeletal:     Cervical back: Normal range of motion.  Lymphadenopathy:     Cervical: No cervical adenopathy.  Neurological:     Mental Status: She is alert.     Wt Readings from Last 3 Encounters:  08/07/20 200 lb (90.7 kg)  07/09/20 201 lb (91.2 kg)  12/01/19 192 lb 0.3 oz (87.1 kg)    BP 132/78   Pulse (!) 117   Ht 5\' 5"  (1.651 m)   Wt 200 lb (90.7 kg)   LMP 07/21/2020 (Exact Date)   SpO2 98%   BMI 33.28 kg/m   Assessment and Plan: 1. Acute otitis media, unspecified otitis media type Take tylenol or advil if needed for pain - amoxicillin-clavulanate (AUGMENTIN) 875-125 MG tablet; Take 1 tablet by mouth 2 (two) times daily for 10 days.  Dispense: 20 tablet; Refill: 0  2. Impacted cerumen of left ear Needs ENT to remove cerumen and evaluate hearing loss - Ambulatory referral to ENT   Partially dictated using Dragon software. Any errors are unintentional.  07/23/2020, MD [facility]  08/07/2020

## 2020-09-12 ENCOUNTER — Other Ambulatory Visit: Payer: 59

## 2020-09-12 DIAGNOSIS — Z20822 Contact with and (suspected) exposure to covid-19: Secondary | ICD-10-CM

## 2020-09-13 LAB — SARS-COV-2, NAA 2 DAY TAT

## 2020-09-13 LAB — NOVEL CORONAVIRUS, NAA: SARS-CoV-2, NAA: NOT DETECTED

## 2020-12-05 ENCOUNTER — Ambulatory Visit: Payer: Self-pay | Admitting: *Deleted

## 2020-12-05 NOTE — Telephone Encounter (Signed)
Covid positive Yesterday. Congestion, cough and short winded began 3 days ago. Discussed isolation requirements, health precautions, fluids. Any concerns with breathing seek evaluation at the ED/UC immediately. Use albuteral inhaler if needed as directed. OTC medications for symptom relief.

## 2020-12-05 NOTE — Telephone Encounter (Signed)
  Reason for Disposition . Health Information question, no triage required and triager able to answer question  Answer Assessment - Initial Assessment Questions 1. REASON FOR CALL or QUESTION: "What is your reason for calling today?" or "How can I best help you?" or "What question do you have that I can help answer?"     Advice only regarding positive Covid results.  Protocols used: INFORMATION ONLY CALL - NO TRIAGE-A-AH

## 2021-05-01 ENCOUNTER — Other Ambulatory Visit: Payer: Self-pay | Admitting: Obstetrics and Gynecology

## 2021-05-01 DIAGNOSIS — Z3169 Encounter for other general counseling and advice on procreation: Secondary | ICD-10-CM

## 2021-05-07 ENCOUNTER — Telehealth: Payer: 59 | Admitting: Internal Medicine

## 2021-05-10 ENCOUNTER — Ambulatory Visit (INDEPENDENT_AMBULATORY_CARE_PROVIDER_SITE_OTHER): Payer: Self-pay | Admitting: Internal Medicine

## 2021-05-10 ENCOUNTER — Other Ambulatory Visit: Payer: Self-pay

## 2021-05-10 ENCOUNTER — Encounter: Payer: Self-pay | Admitting: Internal Medicine

## 2021-05-10 VITALS — BP 128/70 | HR 124 | Ht 65.0 in | Wt 198.0 lb

## 2021-05-10 DIAGNOSIS — F321 Major depressive disorder, single episode, moderate: Secondary | ICD-10-CM

## 2021-05-10 DIAGNOSIS — M7661 Achilles tendinitis, right leg: Secondary | ICD-10-CM

## 2021-05-10 MED ORDER — PREDNISONE 10 MG PO TABS: ORAL_TABLET | ORAL | 0 refills | Status: AC

## 2021-05-10 NOTE — Progress Notes (Signed)
Date:  05/10/2021   Name:  Evelyn Rangel   DOB:  12-09-1990   MRN:  [id number]   Chief Complaint: Foot Pain  Foot Injury  The incident occurred more than 1 week ago. There was no injury mechanism. The pain is present in the right heel. The quality of the pain is described as shooting. The pain is at a severity of 6/10. The pain is moderate. The pain has been Constant since onset. She reports no foreign bodies present. Patient states that pain is worse in the morning than in the afternoon. Hurts more when she does not bear weight for long periods of time.   Lab Results  Component Value Date   CREATININE 0.82 12/01/2019   BUN 12 12/01/2019   NA 137 12/01/2019   K 4.0 12/01/2019   CL 101 12/01/2019   CO2 26 12/01/2019   Lab Results  Component Value Date   CHOL 216 (H) 10/13/2019   HDL 44 10/13/2019   LDLCALC 149 (H) 10/13/2019   TRIG 125 10/13/2019   CHOLHDL 4.9 (H) 10/13/2019   Lab Results  Component Value Date   TSH 2.80 06/09/2019   Lab Results  Component Value Date   HGBA1C 5.5 10/13/2019   Lab Results  Component Value Date   WBC 9.7 12/01/2019   HGB 14.0 12/01/2019   HCT 41.7 12/01/2019   MCV 84.4 12/01/2019   PLT 432 (H) 12/01/2019   Lab Results  Component Value Date   ALT 32 12/01/2019   AST 26 12/01/2019   ALKPHOS 76 12/01/2019   BILITOT 0.5 12/01/2019     Review of Systems  Constitutional:  Negative for chills, fatigue and fever.  Respiratory:  Negative for cough and chest tightness.   Musculoskeletal:  Positive for arthralgias. Gait problem: pain in right heel/achilles tendon insertion. Neurological:  Negative for dizziness and headaches.   Patient Active Problem List   Diagnosis Date Noted   Current moderate 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 prior episode (HCC) 10/13/2019   Mild intermittent asthma without complication 06/14/2019   Prediabetes 06/14/2019   Vitamin D deficiency 06/14/2019   Vitamin B12 deficiency 06/14/2019   Tobacco  use disorder 06/14/2019   Impingement syndrome of left shoulder 06/14/2019    Allergies  Allergen Reactions   Azithromycin Hives and Rash    Past Surgical History:  Procedure Laterality Date   lymph node removal     TONSILECTOMY/ADENOIDECTOMY WITH MYRINGOTOMY  2012   TONSILLECTOMY     TYMPANOSTOMY TUBE PLACEMENT     WISDOM TOOTH EXTRACTION  2009    Social History   Tobacco Use   Smoking status: Every Day    Packs/day: 0.50    Years: 7.00    Pack years: 3.50    Types: Cigarettes   Smokeless tobacco: Never  Vaping Use   Vaping Use: Never used  Substance Use Topics   Alcohol use: Yes    Alcohol/week: 1.0 standard drink    Types: 1 Standard drinks or equivalent per week   Drug use: Not Currently    Types: Marijuana     Medication list has been reviewed and updated.  Current Meds  Medication Sig   albuterol (VENTOLIN HFA) 108 (90 Base) MCG/ACT inhaler Inhale into the lungs every 6 (six) hours as needed for wheezing or shortness of breath.   amitriptyline (ELAVIL) 25 MG tablet Take 50 mg by mouth at bedtime.   beclomethasone (QVAR REDIHALER) 80 MCG/ACT inhaler Inhale into the lungs as needed.  Multiple Vitamins-Minerals (MULTIVITAMIN WITH MINERALS) tablet Take 1 tablet by mouth daily.   predniSONE (DELTASONE) 10 MG tablet Take 6 on day 1and 2, 5 on day 3 and 4, 4 on day 5 and 6 , 3 on day 7 and 8, 2 on day 9 and 10 and 1 on day 11 and 12 then stop.   progesterone (PROMETRIUM) 200 MG capsule Take by mouth.   sertraline (ZOLOFT) 25 MG tablet Take 25 mg by mouth daily. Dr. Billy Coast    [facility] 2/9 Scores 05/10/2021 08/07/2020 07/09/2020 10/13/2019  PHQ - 2 Score 0 0 0 0  PHQ- 9 Score 0 0 0 0    GAD 7 : Generalized Anxiety Score 05/10/2021 08/07/2020 07/09/2020  Nervous, Anxious, on Edge 1 0 0  Control/stop worrying 1 0 0  Worry too much - different things 0 0 0  Trouble relaxing 1 0 0  Restless 0 0 0  Easily annoyed or irritable 0 0 0  Afraid - awful might happen 0  0 0  Total GAD 7 Score 3 0 0  Anxiety Difficulty Not difficult at all Not difficult at all -    BP Readings from Last 3 Encounters:  05/10/21 128/70  08/07/20 132/78  07/09/20 112/78    Physical Exam Vitals and nursing note reviewed.  Constitutional:      General: She is not in acute distress.    Appearance: She is well-developed.  HENT:     Head: Normocephalic and atraumatic.  Pulmonary:     Effort: Pulmonary effort is normal. No respiratory distress.  Musculoskeletal:     Right foot: Normal range of motion. Swelling (over posterior heel), tenderness and bony tenderness present. No deformity.     Left foot: Normal.  Skin:    General: Skin is warm and dry.     Findings: No rash.  Neurological:     Mental Status: She is alert and oriented to person, place, and time.  Psychiatric:        Mood and Affect: Mood normal.        Behavior: Behavior normal.    Wt Readings from Last 3 Encounters:  05/10/21 198 lb (89.8 kg)  08/07/20 200 lb (90.7 kg)  07/09/20 201 lb (91.2 kg)    BP 128/70   Pulse (!) 124   Ht 5\' 5"  (1.651 m)   Wt 198 lb (89.8 kg)   SpO2 94%   BMI 32.95 kg/m   Assessment and Plan: 1. Achilles tendinitis of right lower extremity Will try ice at the end of the day. Steroid taper and rest. If no improvement, will refer to Podiatry or Sports Medicine - predniSONE (DELTASONE) 10 MG tablet; Take 6 on day 1and 2, 5 on day 3 and 4, 4 on day 5 and 6 , 3 on day 7 and 8, 2 on day 9 and 10 and 1 on day 11 and 12 then stop.  Dispense: 42 tablet; Refill: 0  2. Current moderate 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 prior episode (HCC) Clinically stable on current regimen with good control of symptoms, No SI or HI. Will continue current therapy as prescribed by Psych   Partially dictated using . Any errors are unintentional.  [employer], MD [facility]  05/10/2021

## 2021-05-23 ENCOUNTER — Ambulatory Visit: Payer: 59

## 2021-05-24 ENCOUNTER — Ambulatory Visit
Admission: RE | Admit: 2021-05-24 | Discharge: 2021-05-24 | Disposition: A | Discharge status: Home / Self Care | Payer: 59 | Source: Ambulatory Visit | Attending: Obstetrics and Gynecology | Admitting: Obstetrics and Gynecology

## 2021-05-24 ENCOUNTER — Other Ambulatory Visit: Payer: Self-pay

## 2021-05-24 DIAGNOSIS — N979 Female infertility, unspecified: Secondary | ICD-10-CM | POA: Insufficient documentation

## 2021-05-24 DIAGNOSIS — Z3169 Encounter for other general counseling and advice on procreation: Secondary | ICD-10-CM

## 2021-05-24 MED ORDER — IOHEXOL 300 MG/ML  SOLN
25.0000 mL | Freq: Once | INTRAMUSCULAR | Status: AC | PRN
  Administered 2021-05-24: 25 mL

## 2021-05-24 NOTE — Procedures (Signed)
[id number]  10-Jul-1991 30 y.o. @TODAY @  , MD  Hysterosalpingogram Procedure Note  Date of procedure: 05/24/2021   Pre-operative Diagnosis: Infertility  Post-operative Diagnosis: same, patent bilateral tubes  Procedure: Hysterosalpingogram  Surgeon: 05/26/2021, MD  Assistant(s):  Radiology assistant. The radiologist present for today read the imaging and agreed with findings below.  Anesthesia: None  Estimated Blood Loss:  None         Complications:  None; patient tolerated the procedure well.         Disposition: To home         Condition: stable  Findings: Bilateral fill and spill of the tubes and a normal endometrial contour was noted.  Procedure Details  HSG procedure discussed with the patient.  Risks, complications, alternatives have been reviewed with her and she agrees to proceed.   The patient presented to the radiology lab and was identified as the correct patient and the procedure verified as an HSG. A verbal Time Out was held with all team members present and in agreement.  Speculum was inserted in to the vagina and the cervix visualized.  The cervix was cleaned with betadine solution. The HSG catheter was inserted and the balloon insufflated with approximately 1.5 ml of air.  Patient was then repositioned for fluoroscopy.  A total of 19 ml of contrast was used for the procedure. The patient tolerated the procedure well, no complications.   Bilateral fill and spill of the tubes and a normal endometrial contour was noted.  Results were reviewed with the patient at the time of the procedure. She verbalized understanding.   Cline Cools, MD 05/24/2021

## 2021-06-17 ENCOUNTER — Encounter: Payer: Self-pay | Admitting: Internal Medicine

## 2021-10-08 DIAGNOSIS — M7661 Achilles tendinitis, right leg: Secondary | ICD-10-CM | POA: Diagnosis not present

## 2021-10-08 DIAGNOSIS — M6281 Muscle weakness (generalized): Secondary | ICD-10-CM | POA: Diagnosis not present

## 2021-10-10 DIAGNOSIS — M6281 Muscle weakness (generalized): Secondary | ICD-10-CM | POA: Diagnosis not present

## 2021-10-10 DIAGNOSIS — M7661 Achilles tendinitis, right leg: Secondary | ICD-10-CM | POA: Diagnosis not present

## 2021-10-10 DIAGNOSIS — F411 Generalized anxiety disorder: Secondary | ICD-10-CM | POA: Diagnosis not present

## 2021-10-14 DIAGNOSIS — Z319 Encounter for procreative management, unspecified: Secondary | ICD-10-CM | POA: Diagnosis not present

## 2021-10-14 DIAGNOSIS — Z3169 Encounter for other general counseling and advice on procreation: Secondary | ICD-10-CM | POA: Diagnosis not present

## 2021-10-15 ENCOUNTER — Encounter: Payer: Self-pay | Admitting: Internal Medicine

## 2021-10-15 DIAGNOSIS — M6281 Muscle weakness (generalized): Secondary | ICD-10-CM | POA: Diagnosis not present

## 2021-10-15 DIAGNOSIS — M7661 Achilles tendinitis, right leg: Secondary | ICD-10-CM | POA: Diagnosis not present

## 2021-10-17 DIAGNOSIS — M7661 Achilles tendinitis, right leg: Secondary | ICD-10-CM | POA: Diagnosis not present

## 2021-10-17 DIAGNOSIS — M6281 Muscle weakness (generalized): Secondary | ICD-10-CM | POA: Diagnosis not present

## 2021-10-21 DIAGNOSIS — M7661 Achilles tendinitis, right leg: Secondary | ICD-10-CM | POA: Diagnosis not present

## 2021-10-21 DIAGNOSIS — F411 Generalized anxiety disorder: Secondary | ICD-10-CM | POA: Diagnosis not present

## 2021-10-21 DIAGNOSIS — M6281 Muscle weakness (generalized): Secondary | ICD-10-CM | POA: Diagnosis not present

## 2021-10-26 DIAGNOSIS — S86002A Unspecified injury of left Achilles tendon, initial encounter: Secondary | ICD-10-CM | POA: Diagnosis not present

## 2021-10-28 DIAGNOSIS — M7661 Achilles tendinitis, right leg: Secondary | ICD-10-CM | POA: Diagnosis not present

## 2021-10-28 DIAGNOSIS — M6281 Muscle weakness (generalized): Secondary | ICD-10-CM | POA: Diagnosis not present

## 2021-10-29 ENCOUNTER — Other Ambulatory Visit: Payer: Self-pay | Admitting: Podiatry

## 2021-10-29 DIAGNOSIS — S86012A Strain of left Achilles tendon, initial encounter: Secondary | ICD-10-CM

## 2021-10-29 DIAGNOSIS — M216X1 Other acquired deformities of right foot: Secondary | ICD-10-CM | POA: Diagnosis not present

## 2021-10-29 DIAGNOSIS — M7662 Achilles tendinitis, left leg: Secondary | ICD-10-CM | POA: Diagnosis not present

## 2021-10-29 DIAGNOSIS — M25572 Pain in left ankle and joints of left foot: Secondary | ICD-10-CM | POA: Diagnosis not present

## 2021-10-29 DIAGNOSIS — M7661 Achilles tendinitis, right leg: Secondary | ICD-10-CM | POA: Diagnosis not present

## 2021-10-30 ENCOUNTER — Encounter: Payer: Self-pay | Admitting: Podiatry

## 2021-10-30 ENCOUNTER — Ambulatory Visit
Admission: RE | Admit: 2021-10-30 | Discharge: 2021-10-30 | Disposition: A | Discharge status: Home / Self Care | Payer: BC Managed Care – PPO | Source: Ambulatory Visit | Attending: Podiatry | Admitting: Podiatry

## 2021-10-30 ENCOUNTER — Other Ambulatory Visit: Payer: Self-pay | Admitting: Podiatry

## 2021-10-30 ENCOUNTER — Other Ambulatory Visit: Payer: Self-pay

## 2021-10-30 DIAGNOSIS — S86012A Strain of left Achilles tendon, initial encounter: Secondary | ICD-10-CM | POA: Insufficient documentation

## 2021-10-30 DIAGNOSIS — M7989 Other specified soft tissue disorders: Secondary | ICD-10-CM | POA: Diagnosis not present

## 2021-11-05 NOTE — Discharge Instructions (Addendum)
[facility] ?[facility] ? ?POST OPERATIVE INSTRUCTIONS FOR DR. Ether Griffins AND DR. Finnley Larusso ?[facility] PODIATRY DEPARTMENT ? ? ?Take your medication as prescribed.  Pain medication should be taken only as needed.  You may also take ibuprofen or Tylenol between pain medication doses if pain is still severe.  If pain is only mild you may also only take ibuprofen or Tylenol.  If pain is substantial and severe despite taking medicine and try taking 1 pain tablet every 4 hours.  If still severe then try taking 2 pain tablets every 6 hours.  If still severe try taking 2 pain tablets every 4 hours.  If still severe please contact physician. ? ?Keep the dressing clean, dry and intact.  Do not remove dressings.  Remain nonweightbearing at all times to left lower extremity. ? ?Keep your foot elevated above the heart level for the first 48 hours.  Continue elevation thereafter to improve swelling.  You may also apply ice to the left ankle for maximum 10 minutes out of every 1 hour as needed. ? ?Continue usage of knee scooter, crutches, or wheelchair to get around to not put any pressure on your left foot.  The more that she keep the leg elevated the less swelling and pain you likely have.  Please try to take it easy for the next couple of weeks. ? ?Do not take a shower. Baths are permissible as long as the foot is kept out of the water.  ? ?Every hour you are awake:  ?Bend your knee 15 times. ?Massage calf 15 times ? ?Call [facility] [phone]) if any of the following problems occur: ?You develop a temperature or fever. ?The bandage becomes saturated with blood. ?Medication does not stop your pain. ?Injury of the foot occurs. ?Any symptoms of infection including redness, odor, or red streaks running from wound. ?

## 2021-11-07 ENCOUNTER — Encounter
Admission: RE | Disposition: A | Discharge status: Home / Self Care | Payer: Self-pay | Source: Home / Self Care | Attending: Podiatry

## 2021-11-07 ENCOUNTER — Ambulatory Visit: Payer: Self-pay

## 2021-11-07 ENCOUNTER — Other Ambulatory Visit: Payer: Self-pay

## 2021-11-07 ENCOUNTER — Ambulatory Visit
Admission: RE | Admit: 2021-11-07 | Discharge: 2021-11-07 | Disposition: A | Discharge status: Home / Self Care | Payer: BC Managed Care – PPO | Attending: Podiatry | Admitting: Podiatry

## 2021-11-07 ENCOUNTER — Ambulatory Visit: Payer: BC Managed Care – PPO | Admitting: Anesthesiology

## 2021-11-07 ENCOUNTER — Encounter: Payer: Self-pay | Admitting: Podiatry

## 2021-11-07 DIAGNOSIS — M25572 Pain in left ankle and joints of left foot: Secondary | ICD-10-CM | POA: Diagnosis not present

## 2021-11-07 DIAGNOSIS — M7662 Achilles tendinitis, left leg: Secondary | ICD-10-CM | POA: Insufficient documentation

## 2021-11-07 DIAGNOSIS — E669 Obesity, unspecified: Secondary | ICD-10-CM | POA: Diagnosis not present

## 2021-11-07 DIAGNOSIS — F32A Depression, unspecified: Secondary | ICD-10-CM | POA: Insufficient documentation

## 2021-11-07 DIAGNOSIS — Z6830 Body mass index (BMI) 30.0-30.9, adult: Secondary | ICD-10-CM | POA: Insufficient documentation

## 2021-11-07 DIAGNOSIS — S86012A Strain of left Achilles tendon, initial encounter: Secondary | ICD-10-CM | POA: Insufficient documentation

## 2021-11-07 DIAGNOSIS — S86012D Strain of left Achilles tendon, subsequent encounter: Secondary | ICD-10-CM

## 2021-11-07 DIAGNOSIS — G8918 Other acute postprocedural pain: Secondary | ICD-10-CM | POA: Diagnosis not present

## 2021-11-07 DIAGNOSIS — J45909 Unspecified asthma, uncomplicated: Secondary | ICD-10-CM | POA: Insufficient documentation

## 2021-11-07 DIAGNOSIS — Y9341 Activity, dancing: Secondary | ICD-10-CM | POA: Insufficient documentation

## 2021-11-07 DIAGNOSIS — X509XXA Other and unspecified overexertion or strenuous movements or postures, initial encounter: Secondary | ICD-10-CM | POA: Insufficient documentation

## 2021-11-07 DIAGNOSIS — Z87891 Personal history of nicotine dependence: Secondary | ICD-10-CM | POA: Diagnosis not present

## 2021-11-07 DIAGNOSIS — F419 Anxiety disorder, unspecified: Secondary | ICD-10-CM | POA: Insufficient documentation

## 2021-11-07 HISTORY — PX: ACHILLES TENDON SURGERY: SHX542

## 2021-11-07 LAB — POCT PREGNANCY, URINE: Preg Test, Ur: NEGATIVE

## 2021-11-07 SURGERY — REPAIR, TENDON, ACHILLES
Anesthesia: Regional | Laterality: Left

## 2021-11-07 MED ORDER — ONDANSETRON HCL 4 MG/2ML IJ SOLN
INTRAMUSCULAR | Status: DC | PRN
  Administered 2021-11-07: 4 mg via INTRAVENOUS

## 2021-11-07 MED ORDER — PROPOFOL 10 MG/ML IV BOLUS
INTRAVENOUS | Status: DC | PRN
  Administered 2021-11-07: 200 mg via INTRAVENOUS

## 2021-11-07 MED ORDER — BUPIVACAINE HCL (PF) 0.5 % IJ SOLN
INTRAMUSCULAR | Status: DC | PRN
  Administered 2021-11-07: 20 mL

## 2021-11-07 MED ORDER — ASPIRIN EC 81 MG PO TBEC: 81.0000 mg | DELAYED_RELEASE_TABLET | Freq: Two times a day (BID) | ORAL | 0 refills | Status: AC

## 2021-11-07 MED ORDER — DEXAMETHASONE SODIUM PHOSPHATE 4 MG/ML IJ SOLN
INTRAMUSCULAR | Status: DC | PRN
  Administered 2021-11-07: 4 mg via INTRAVENOUS

## 2021-11-07 MED ORDER — ONDANSETRON HCL 4 MG/2ML IJ SOLN
4.0000 mg | Freq: Once | INTRAMUSCULAR | Status: AC
Start: 2021-11-07 — End: 2021-11-07
  Administered 2021-11-07: 4 mg via INTRAVENOUS

## 2021-11-07 MED ORDER — OXYCODONE HCL 5 MG PO TABS: 5.0000 mg | ORAL_TABLET | Freq: Once | ORAL | Status: DC | PRN

## 2021-11-07 MED ORDER — FENTANYL CITRATE PF 50 MCG/ML IJ SOSY: 25.0000 ug | PREFILLED_SYRINGE | INTRAMUSCULAR | Status: DC | PRN

## 2021-11-07 MED ORDER — LACTATED RINGERS IV SOLN
INTRAVENOUS | Status: DC
Start: 2021-11-07 — End: 2021-11-07

## 2021-11-07 MED ORDER — BUPIVACAINE LIPOSOME 1.3 % IJ SUSP
INTRAMUSCULAR | Status: DC | PRN
  Administered 2021-11-07: 20 mL via PERINEURAL

## 2021-11-07 MED ORDER — ONDANSETRON HCL 4 MG PO TABS: 4.0000 mg | ORAL_TABLET | Freq: Three times a day (TID) | ORAL | 0 refills | Status: DC | PRN

## 2021-11-07 MED ORDER — CEFAZOLIN SODIUM-DEXTROSE 2-4 GM/100ML-% IV SOLN: 2.0000 g | INTRAVENOUS | Status: DC

## 2021-11-07 MED ORDER — MIDAZOLAM HCL 5 MG/5ML IJ SOLN
INTRAMUSCULAR | Status: DC | PRN
Start: 2021-11-07 — End: 2021-11-07
  Administered 2021-11-07: 2 mg via INTRAVENOUS

## 2021-11-07 MED ORDER — ACETAMINOPHEN 160 MG/5ML PO SOLN
325.0000 mg | ORAL | Status: DC | PRN
Start: 2021-11-07 — End: 2021-11-07

## 2021-11-07 MED ORDER — SODIUM CHLORIDE 0.9 % IR SOLN
Status: DC | PRN
  Administered 2021-11-07: 1000 mL

## 2021-11-07 MED ORDER — OXYCODONE HCL 5 MG/5ML PO SOLN: 5.0000 mg | Freq: Once | ORAL | Status: DC | PRN

## 2021-11-07 MED ORDER — AMOXICILLIN-POT CLAVULANATE 875-125 MG PO TABS: 1.0000 | ORAL_TABLET | Freq: Two times a day (BID) | ORAL | 0 refills | Status: AC

## 2021-11-07 MED ORDER — ACETAMINOPHEN 325 MG PO TABS
325.0000 mg | ORAL_TABLET | ORAL | Status: DC | PRN
Start: 2021-11-07 — End: 2021-11-07

## 2021-11-07 MED ORDER — FENTANYL CITRATE (PF) 100 MCG/2ML IJ SOLN
INTRAMUSCULAR | Status: DC | PRN
  Administered 2021-11-07: 100 ug via INTRAVENOUS
  Administered 2021-11-07: 50 ug via INTRAVENOUS

## 2021-11-07 MED ORDER — LIDOCAINE HCL (CARDIAC) PF 100 MG/5ML IV SOSY
PREFILLED_SYRINGE | INTRAVENOUS | Status: DC | PRN
  Administered 2021-11-07: 20 mg via INTRAVENOUS

## 2021-11-07 MED ORDER — SUCCINYLCHOLINE CHLORIDE 200 MG/10ML IV SOSY
PREFILLED_SYRINGE | INTRAVENOUS | Status: DC | PRN
  Administered 2021-11-07: 100 mg via INTRAVENOUS

## 2021-11-07 MED ORDER — BUPIVACAINE HCL (PF) 0.5 % IJ SOLN: INTRAMUSCULAR | Status: DC | PRN

## 2021-11-07 MED ORDER — HYDROCODONE-ACETAMINOPHEN 7.5-325 MG PO TABS: 1.0000 | ORAL_TABLET | Freq: Four times a day (QID) | ORAL | 0 refills | Status: AC | PRN

## 2021-11-07 SURGICAL SUPPLY — 36 items
BLADE SURG 15 STRL LF DISP TIS (BLADE) IMPLANT
BLADE SURG 15 STRL SS (BLADE)
BNDG CMPR STD VLCR NS LF 5.8X4 (GAUZE/BANDAGES/DRESSINGS) ×1
BNDG CMPR STD VLCR NS LF 5.8X6 (GAUZE/BANDAGES/DRESSINGS) ×1
BNDG ELASTIC 4X5.8 VLCR NS LF (GAUZE/BANDAGES/DRESSINGS) ×2 IMPLANT
BNDG ELASTIC 6X5.8 VLCR NS LF (GAUZE/BANDAGES/DRESSINGS) ×2 IMPLANT
BNDG ESMARK 4X12 TAN STRL LF (GAUZE/BANDAGES/DRESSINGS) ×2 IMPLANT
BNDG GAUZE ELAST 4 BULKY (GAUZE/BANDAGES/DRESSINGS) ×2 IMPLANT
CANISTER SUCT 1200ML W/VALVE (MISCELLANEOUS) ×2 IMPLANT
COVER LIGHT HANDLE UNIVERSAL (MISCELLANEOUS) ×4 IMPLANT
CUFF TOURN SGL QUICK 30 (TOURNIQUET CUFF)
CUFF TRNQT CYL 30X4X21-28X (TOURNIQUET CUFF) IMPLANT
DRAPE FLUOR MINI C-ARM 54X84 (DRAPES) ×2 IMPLANT
DURAPREP 26ML APPLICATOR (WOUND CARE) ×2 IMPLANT
ELECT REM PT RETURN 9FT ADLT (ELECTROSURGICAL) ×2
ELECTRODE REM PT RTRN 9FT ADLT (ELECTROSURGICAL) ×1 IMPLANT
GAUZE SPONGE 4X4 12PLY STRL (GAUZE/BANDAGES/DRESSINGS) ×2 IMPLANT
GAUZE XEROFORM 1X8 LF (GAUZE/BANDAGES/DRESSINGS) ×2 IMPLANT
GLOVE SURG POLYISO LF SZ7 (GLOVE) ×2 IMPLANT
GLOVE SURG UNDER POLY LF SZ7 (GLOVE) ×2 IMPLANT
GOWN STRL REUS W/ TWL LRG LVL3 (GOWN DISPOSABLE) ×2 IMPLANT
GOWN STRL REUS W/TWL LRG LVL3 (GOWN DISPOSABLE) ×4
KIT ANCHOR IMP ACH MID SPEED P (Orthopedic Implant) ×1 IMPLANT
KIT PARS SUTURE TAPE IMPL (Miscellaneous) ×1 IMPLANT
KIT TURNOVER KIT A (KITS) ×2 IMPLANT
NS IRRIG 500ML POUR BTL (IV SOLUTION) ×2 IMPLANT
PACK EXTREMITY ARMC (MISCELLANEOUS) ×2 IMPLANT
PADDING CAST BLEND 4X4 NS (MISCELLANEOUS) ×6 IMPLANT
SPLINT CAST 1 STEP 4X30 (MISCELLANEOUS) ×2 IMPLANT
STOCKINETTE IMPERVIOUS LG (DRAPES) ×2 IMPLANT
SUT ETHILON 3-0 FS-10 30 BLK (SUTURE) ×2
SUT ETHILON 4 0 PS 2 18 (SUTURE) IMPLANT
SUT VIC AB 3-0 SH 27 (SUTURE) ×2
SUT VIC AB 3-0 SH 27X BRD (SUTURE) IMPLANT
SUT VIC AB 4-0 FS2 27 (SUTURE) IMPLANT
SUTURE EHLN 3-0 FS-10 30 BLK (SUTURE) IMPLANT

## 2021-11-07 NOTE — Op Note (Signed)
PODIATRY / FOOT AND ANKLE SURGERY OPERATIVE REPORT ? ? ? ?SURGEON: Rosetta Posner, DPM ? ?PRE-OPERATIVE DIAGNOSIS:  ?1.  Left Achilles tendon rupture midsubstance ?2.  Chronic left Achilles tendinosis ? ?POST-OPERATIVE DIAGNOSIS: Same ? ?PROCEDURE(S): ?Left Achilles tendon rupture repair ? ?HEMOSTASIS: Left thigh tourniquet ? ?ANESTHESIA: general ? ?ESTIMATED BLOOD LOSS: 10 cc ? ?FINDING(S): ?1.  Mid substance Achilles tendon rupture with frayed tendon edges, plantaris intact, gap approximately 2 cm ? ?PATHOLOGY/SPECIMEN(S): None ? ?INDICATIONS:   ?Evelyn Rangel is a 31 y.o. female who presents with an acute injury to the left Achilles area.  Patient was dancing at a wedding and took a misstep and felt a pop in the back of her leg.  She believed that she had ruptured her Achilles tendon.  She has not been putting weight on the foot since the injury.  Came into the office with an obvious Achilles tendon rupture with Janee Morn test negative and Matles test positive.  Patient also had an MRI taken which showed a mid substance Achilles tendon rupture with chronic Achilles tendinosis with gapping of the tendon of approximately 1.7 cm.  Patient was placed in a posterior splint with maximal plantarflexion.  All treatment options were discussed with the patient both conservative and surgical attempts at correction clean potential risks and complications at this time patient is elected for surgical procedure consisting of left Achilles tendon rupture repair. ? ?DESCRIPTION: ?After obtaining full informed written consent, the patient was brought back to the operating room and placed supine upon the operating table.  The patient received IV antibiotics prior to induction.  After obtaining adequate anesthesia, the patient was prepped and draped in the standard fashion.  Patient had a preoperative block performed by anesthesia.  An Esmarch bandage was used to exsanguinate the left lower extremity and the pneumatic thigh tourniquet  was inflated. ? ?Attention was directed to the posterior aspect of the left lower leg around the Achilles tendon where the Achilles tendon was palpated to the area where there was a palpable dell noted approximately 3 cm from the insertion point of the posterior aspect of the calcaneus in the watershed area.  This area was marked out in a longitudinal incision was made over the area where the proximal piece was located all the way to the distal piece which was approximately 2 cm.  The incision was deepened through the subcutaneous tissues utilizing sharp and blunt dissection and care was taken to identify and retract all vital neurovascular structures no venous contributories were cauterized as necessary.  The peritenon still appeared to be for the most part intact so this was also incised and opened up.  The proximal Achilles tendon that had slightly retracted appeared to be visualized as well as the distal end of the tendon.  At this time the Arthrex PARS system was then used as the jig was then placed with the appropriate orientation through the proximal stump and into the more substantial proximal area of the tendon.  Once it appeared to be adequately placed within the jig.  The suture was then passed from medial to lateral with the appropriate orientation.  Once the suture was then placed the jig was then removed pulling the suture through the incision site.  Standard technique was then used to prepare the suture yet further pulling the blue suture and green suture together through the loop.  3 sutures were then left intact and tested and pulled with the tendon noted to have excellent seating appearing to have  good grip of the Achilles tendon. ? ?At this time attention was directed to the posterior heel where 2 small percutaneous incisions were made at the posterior lateral and posterior medial aspect of the heel slightly distal to the Achilles tendon insertion.  The guide for the Arthrex knotless anchors root  was then used and the drill was placed through the guide and into the posterior aspect of the calcaneus with the appropriate orientation x2.  Guidewires were then used to mark the areas of the holes after they were tapped to prepare the holes for insertion of the anchors.  At this time the banana lasso was then placed through the most distal aspect of the tendon near the insertion point on the calcaneus and passed through the inner portion of the Achilles tendon distally and exited through the central aspect of the Achilles tendon 1 was directed in such way to be slightly lateral and one was slightly medial.  Once the banana lasso's were in place the suture was then taken from the distal end and passed through the lasso and exiting through the distal percutaneous holes x2.  The foot was then held in slight plantarflexion compared to the opposing side approximately 10 to 15 degrees and more plantarflexion than the contralateral limb.  While doing this the suture anchor was prepared and the suture was passed through the anchor from the previous portion of the repair and into the calcaneus with the appropriate tension.  The same thing was then done with the other limb of suture and plugged in to the calcaneus with another anchor from Arthrex.  Excellent seating was noted overall.  This completed the repair of the Achilles tendon as the Achilles tendons appeared to be well approximated overall.  Calf squeeze did produce plantarflexion relatively similar to the contralateral limb indicative of strength of the repair.  A flush was performed with copious amounts normal sterile saline.  The peritenon was then reapproximated well coapted with 3-0 Vicryl.  The skin at the 3 incision sites was then reapproximated well coapted with 3-0 nylon in a combination of simple and horizontal mattress type stitching. ? ?The pneumatic thigh tourniquet was deflated and a prompt hyperemic response was noted all digits left foot.  A  postoperative dressing was then applied consisting of Xeroform to the incision lines followed by 4 x 4 gauze, Kerlix, Webril, posterior splint, Ace wrap.  The posterior splint was held in resting plantarflexion.  The patient tolerated the procedure and anesthesia well was transferred to recovery room vital signs intact and vascular status intact all toes of both feet.  Patient to be discharged with the appropriate orders and follow-up instructions as well as medications.  Patient is to remain nonweightbearing at all times to left lower extremity and keep dressings clean, dry, and intact and is to not remove the dressings.  Stressed importance. ? ?COMPLICATIONS: None ? ?CONDITION: Good, stable ? ?Rosetta Posner, DPM ? ?

## 2021-11-07 NOTE — Transfer of Care (Signed)
Immediate Anesthesia Transfer of Care Note ? ?Patient: Evelyn Rangel ? ?Procedure(s) Performed: ACHILLES TENDON REPAIR (Left) ? ?Patient Location: PACU ? ?Anesthesia Type: General, Regional ? ?Level of Consciousness: awake, alert  and patient cooperative ? ?Airway and Oxygen Therapy: Patient Spontanous Breathing and Patient connected to supplemental oxygen ? ?Post-op Assessment: Post-op Vital signs reviewed, Patient's Cardiovascular Status Stable, Respiratory Function Stable, Patent Airway and No signs of Nausea or vomiting ? ?Post-op Vital Signs: Reviewed and stable ? ?Complications: No notable events documented. ? ?

## 2021-11-07 NOTE — Anesthesia Preprocedure Evaluation (Signed)
Anesthesia Evaluation  ?Patient identified by MRN, date of birth, ID band ?Patient awake ? ? ? ?Reviewed: ?Allergy & Precautions, NPO status  ? ?Airway ?Mallampati: II ? ?TM Distance: >3 FB ? ? ? ? Dental ?  ?Pulmonary ?asthma , former smoker,  ?  ?Pulmonary exam normal ? ? ? ? ? ? ? Cardiovascular ?negative cardio ROS ? ? ?Rhythm:Regular Rate:Normal ? ? ?  ?Neuro/Psych ?PSYCHIATRIC DISORDERS Anxiety Depression   ? GI/Hepatic ?  ?Endo/Other  ?Obesity - BMI > 30 ? Renal/GU ?  ? ?  ?Musculoskeletal ? ?(+) Arthritis ,  ? Abdominal ?  ?Peds ? Hematology ?  ?Anesthesia Other Findings ? ? Reproductive/Obstetrics ? ?  ? ? ? ? ? ? ? ? ? ? ? ? ? ?  ?  ? ? ? ? ? ? ? ? ?Anesthesia Physical ?Anesthesia Plan ? ?ASA: 2 ? ?Anesthesia Plan: General and Regional  ? ?Post-op Pain Management: Regional block  ? ?Induction: Intravenous ? ?PONV Risk Score and Plan: Treatment may vary due to age or medical condition ? ?Airway Management Planned: LMA ? ?Additional Equipment:  ? ?Intra-op Plan:  ? ?Post-operative Plan:  ? ?Informed Consent: I have reviewed the patients History and Physical, chart, labs and discussed the procedure including the risks, benefits and alternatives for the proposed anesthesia with the patient or authorized representative who has indicated his/her understanding and acceptance.  ? ? ? ?Dental advisory given ? ?Plan Discussed with: CRNA ? ?Anesthesia Plan Comments:   ? ? ? ? ? ? ?Anesthesia Quick Evaluation ? ?

## 2021-11-07 NOTE — Anesthesia Postprocedure Evaluation (Signed)
Anesthesia Post Note ? ?Patient: Evelyn Rangel ? ?Procedure(s) Performed: ACHILLES TENDON REPAIR (Left) ? ? ?  ?Patient location during evaluation: PACU ?Anesthesia Type: Regional ?Level of consciousness: awake ?Pain management: pain level controlled ?Vital Signs Assessment: post-procedure vital signs reviewed and stable ?Respiratory status: respiratory function stable ?Cardiovascular status: stable ?Postop Assessment: no signs of nausea or vomiting ?Anesthetic complications: no ? ? ?No notable events documented. ? ?Veda Canning ? ? ? ? ? ?

## 2021-11-07 NOTE — H&P (Signed)
HISTORY AND PHYSICAL INTERVAL NOTE: ? ?11/07/2021 ? ?7:18 AM ? ?Evelyn Rangel  has presented today for surgery, with the diagnosis of S86.012A - Achilles rupture, left ?M76.62 - Achilles tendonitis.  The various methods of treatment have been discussed with the patient.  No guarantees were given.  After consideration of risks, benefits and other options for treatment, the patient has consented to surgery.  I have reviewed the patients? chart and labs.   ? ?PROCEDURE: ?LEFT ACHILLES TENDON RUPTURE REPAIR ? ?A history and physical examination was performed in my office.  The patient was reexamined.  There have been no changes to this history and physical examination. ? ?Rosetta Posner, DPM ? ?

## 2021-11-07 NOTE — Anesthesia Procedure Notes (Signed)
Procedure Name: Intubation ?Date/Time: 11/07/2021 7:43 AM ?Performed by: Cameron Ali, CRNA ?Pre-anesthesia Checklist: Patient identified, Emergency Drugs available, Suction available, Patient being monitored and Timeout performed ?Patient Re-evaluated:Patient Re-evaluated prior to induction ?Oxygen Delivery Method: Circle system utilized ?Preoxygenation: Pre-oxygenation with 100% oxygen ?Induction Type: IV induction ?Ventilation: Mask ventilation without difficulty ?Laryngoscope Size: Mac and 3 ?Grade View: Grade I ?Tube type: Oral ?Tube size: 7.5 mm ?Number of attempts: 1 ?Placement Confirmation: ETT inserted through vocal cords under direct vision, positive ETCO2 and breath sounds checked- equal and bilateral ?Tube secured with: Tape ?Dental Injury: Teeth and Oropharynx as per pre-operative assessment  ? ? ? ? ?

## 2021-11-07 NOTE — Anesthesia Procedure Notes (Signed)
Anesthesia Regional Block: Popliteal block  ? ?Pre-Anesthetic Checklist: , timeout performed,  Correct Patient, Correct Site, Correct Laterality,  Correct Procedure, Correct Position, site marked,  Risks and benefits discussed,  Surgical consent,  Pre-op evaluation,  At surgeon's request and post-op pain management ? ?Laterality: Left ? ?Prep: chloraprep     ?  ?Needles:  ?Injection technique: Single-shot ? ?Needle Type: Echogenic Needle   ? ? ?Needle Length: 9cm  ?Needle Gauge: 21  ? ? ? ?Additional Needles: ? ? ?Procedures:,,,, ultrasound used (permanent image in chart),,    ?Narrative:  ?Start time: 11/07/2021 7:03 AM ?End time: 11/07/2021 7:08 AM ?Injection made incrementally with aspirations every 5 mL. ? ?Performed by: Personally  ? ?Additional Notes: ?Functioning IV was confirmed and monitors applied. Ultrasound guidance: relevant anatomy identified, needle position confirmed, local anesthetic spread visualized around nerve(s)., vascular puncture avoided.  Image printed for medical record.  Negative aspiration and no paresthesias; incremental administration of local anesthetic. The patient tolerated the procedure well. Vitals signs recorded in RN notes. ? ? ? ?

## 2021-11-08 ENCOUNTER — Encounter: Payer: Self-pay | Admitting: Podiatry

## 2021-11-11 DIAGNOSIS — F411 Generalized anxiety disorder: Secondary | ICD-10-CM | POA: Diagnosis not present

## 2021-11-12 DIAGNOSIS — S86012A Strain of left Achilles tendon, initial encounter: Secondary | ICD-10-CM | POA: Diagnosis not present

## 2021-11-25 DIAGNOSIS — F411 Generalized anxiety disorder: Secondary | ICD-10-CM | POA: Diagnosis not present

## 2021-12-04 DIAGNOSIS — Z09 Encounter for follow-up examination after completed treatment for conditions other than malignant neoplasm: Secondary | ICD-10-CM | POA: Diagnosis not present

## 2021-12-04 DIAGNOSIS — S86012D Strain of left Achilles tendon, subsequent encounter: Secondary | ICD-10-CM | POA: Diagnosis not present

## 2021-12-09 DIAGNOSIS — Z9889 Other specified postprocedural states: Secondary | ICD-10-CM | POA: Diagnosis not present

## 2021-12-09 DIAGNOSIS — F411 Generalized anxiety disorder: Secondary | ICD-10-CM | POA: Diagnosis not present

## 2021-12-09 DIAGNOSIS — M25672 Stiffness of left ankle, not elsewhere classified: Secondary | ICD-10-CM | POA: Diagnosis not present

## 2021-12-09 DIAGNOSIS — M6281 Muscle weakness (generalized): Secondary | ICD-10-CM | POA: Diagnosis not present

## 2021-12-14 DIAGNOSIS — Z3169 Encounter for other general counseling and advice on procreation: Secondary | ICD-10-CM | POA: Diagnosis not present

## 2021-12-14 DIAGNOSIS — N979 Female infertility, unspecified: Secondary | ICD-10-CM | POA: Diagnosis not present

## 2021-12-16 DIAGNOSIS — Z9889 Other specified postprocedural states: Secondary | ICD-10-CM | POA: Diagnosis not present

## 2021-12-16 DIAGNOSIS — M25672 Stiffness of left ankle, not elsewhere classified: Secondary | ICD-10-CM | POA: Diagnosis not present

## 2021-12-16 DIAGNOSIS — M6281 Muscle weakness (generalized): Secondary | ICD-10-CM | POA: Diagnosis not present

## 2021-12-17 DIAGNOSIS — N979 Female infertility, unspecified: Secondary | ICD-10-CM | POA: Diagnosis not present

## 2021-12-30 DIAGNOSIS — F411 Generalized anxiety disorder: Secondary | ICD-10-CM | POA: Diagnosis not present

## 2022-01-09 DIAGNOSIS — M7661 Achilles tendinitis, right leg: Secondary | ICD-10-CM | POA: Diagnosis not present

## 2022-01-09 DIAGNOSIS — Z9889 Other specified postprocedural states: Secondary | ICD-10-CM | POA: Diagnosis not present

## 2022-01-09 DIAGNOSIS — M6281 Muscle weakness (generalized): Secondary | ICD-10-CM | POA: Diagnosis not present

## 2022-01-09 DIAGNOSIS — M25672 Stiffness of left ankle, not elsewhere classified: Secondary | ICD-10-CM | POA: Diagnosis not present

## 2022-01-13 DIAGNOSIS — Z9889 Other specified postprocedural states: Secondary | ICD-10-CM | POA: Diagnosis not present

## 2022-01-13 DIAGNOSIS — M25672 Stiffness of left ankle, not elsewhere classified: Secondary | ICD-10-CM | POA: Diagnosis not present

## 2022-01-13 DIAGNOSIS — M6281 Muscle weakness (generalized): Secondary | ICD-10-CM | POA: Diagnosis not present

## 2022-01-15 DIAGNOSIS — M6281 Muscle weakness (generalized): Secondary | ICD-10-CM | POA: Diagnosis not present

## 2022-01-15 DIAGNOSIS — M25672 Stiffness of left ankle, not elsewhere classified: Secondary | ICD-10-CM | POA: Diagnosis not present

## 2022-01-15 DIAGNOSIS — Z9889 Other specified postprocedural states: Secondary | ICD-10-CM | POA: Diagnosis not present

## 2022-01-20 DIAGNOSIS — F411 Generalized anxiety disorder: Secondary | ICD-10-CM | POA: Diagnosis not present

## 2022-01-23 DIAGNOSIS — M25672 Stiffness of left ankle, not elsewhere classified: Secondary | ICD-10-CM | POA: Diagnosis not present

## 2022-01-27 DIAGNOSIS — M6281 Muscle weakness (generalized): Secondary | ICD-10-CM | POA: Diagnosis not present

## 2022-01-27 DIAGNOSIS — M25672 Stiffness of left ankle, not elsewhere classified: Secondary | ICD-10-CM | POA: Diagnosis not present

## 2022-01-27 DIAGNOSIS — Z9889 Other specified postprocedural states: Secondary | ICD-10-CM | POA: Diagnosis not present

## 2022-02-03 DIAGNOSIS — Z9889 Other specified postprocedural states: Secondary | ICD-10-CM | POA: Diagnosis not present

## 2022-02-03 DIAGNOSIS — M25672 Stiffness of left ankle, not elsewhere classified: Secondary | ICD-10-CM | POA: Diagnosis not present

## 2022-02-03 DIAGNOSIS — M6281 Muscle weakness (generalized): Secondary | ICD-10-CM | POA: Diagnosis not present

## 2022-02-05 DIAGNOSIS — M25672 Stiffness of left ankle, not elsewhere classified: Secondary | ICD-10-CM | POA: Diagnosis not present

## 2022-02-05 DIAGNOSIS — M6281 Muscle weakness (generalized): Secondary | ICD-10-CM | POA: Diagnosis not present

## 2022-02-05 DIAGNOSIS — Z9889 Other specified postprocedural states: Secondary | ICD-10-CM | POA: Diagnosis not present

## 2022-02-06 DIAGNOSIS — F411 Generalized anxiety disorder: Secondary | ICD-10-CM | POA: Diagnosis not present

## 2022-02-06 DIAGNOSIS — F900 Attention-deficit hyperactivity disorder, predominantly inattentive type: Secondary | ICD-10-CM | POA: Diagnosis not present

## 2022-02-12 DIAGNOSIS — M216X1 Other acquired deformities of right foot: Secondary | ICD-10-CM | POA: Diagnosis not present

## 2022-02-12 DIAGNOSIS — S86012D Strain of left Achilles tendon, subsequent encounter: Secondary | ICD-10-CM | POA: Diagnosis not present

## 2022-02-12 DIAGNOSIS — M7661 Achilles tendinitis, right leg: Secondary | ICD-10-CM | POA: Diagnosis not present

## 2022-02-12 DIAGNOSIS — Z09 Encounter for follow-up examination after completed treatment for conditions other than malignant neoplasm: Secondary | ICD-10-CM | POA: Diagnosis not present

## 2022-02-14 DIAGNOSIS — M6281 Muscle weakness (generalized): Secondary | ICD-10-CM | POA: Diagnosis not present

## 2022-02-14 DIAGNOSIS — Z9889 Other specified postprocedural states: Secondary | ICD-10-CM | POA: Diagnosis not present

## 2022-02-14 DIAGNOSIS — M25672 Stiffness of left ankle, not elsewhere classified: Secondary | ICD-10-CM | POA: Diagnosis not present

## 2022-02-17 DIAGNOSIS — F411 Generalized anxiety disorder: Secondary | ICD-10-CM | POA: Diagnosis not present

## 2022-02-20 DIAGNOSIS — M25672 Stiffness of left ankle, not elsewhere classified: Secondary | ICD-10-CM | POA: Diagnosis not present

## 2022-02-20 DIAGNOSIS — M6281 Muscle weakness (generalized): Secondary | ICD-10-CM | POA: Diagnosis not present

## 2022-02-20 DIAGNOSIS — Z9889 Other specified postprocedural states: Secondary | ICD-10-CM | POA: Diagnosis not present

## 2022-03-03 DIAGNOSIS — F411 Generalized anxiety disorder: Secondary | ICD-10-CM | POA: Diagnosis not present

## 2022-03-20 DIAGNOSIS — M25672 Stiffness of left ankle, not elsewhere classified: Secondary | ICD-10-CM | POA: Diagnosis not present

## 2022-03-20 DIAGNOSIS — M6281 Muscle weakness (generalized): Secondary | ICD-10-CM | POA: Diagnosis not present

## 2022-03-20 DIAGNOSIS — Z9889 Other specified postprocedural states: Secondary | ICD-10-CM | POA: Diagnosis not present

## 2022-03-24 DIAGNOSIS — N979 Female infertility, unspecified: Secondary | ICD-10-CM | POA: Diagnosis not present

## 2022-03-24 DIAGNOSIS — E288 Other ovarian dysfunction: Secondary | ICD-10-CM | POA: Diagnosis not present

## 2022-04-01 DIAGNOSIS — M7731 Calcaneal spur, right foot: Secondary | ICD-10-CM | POA: Diagnosis not present

## 2022-04-01 DIAGNOSIS — S86012D Strain of left Achilles tendon, subsequent encounter: Secondary | ICD-10-CM | POA: Diagnosis not present

## 2022-04-01 DIAGNOSIS — M216X1 Other acquired deformities of right foot: Secondary | ICD-10-CM | POA: Diagnosis not present

## 2022-04-01 DIAGNOSIS — Z09 Encounter for follow-up examination after completed treatment for conditions other than malignant neoplasm: Secondary | ICD-10-CM | POA: Diagnosis not present

## 2022-04-03 DIAGNOSIS — M25672 Stiffness of left ankle, not elsewhere classified: Secondary | ICD-10-CM | POA: Diagnosis not present

## 2022-04-15 DIAGNOSIS — Z9889 Other specified postprocedural states: Secondary | ICD-10-CM | POA: Diagnosis not present

## 2022-04-15 DIAGNOSIS — M6281 Muscle weakness (generalized): Secondary | ICD-10-CM | POA: Diagnosis not present

## 2022-04-15 DIAGNOSIS — M25672 Stiffness of left ankle, not elsewhere classified: Secondary | ICD-10-CM | POA: Diagnosis not present

## 2022-04-16 DIAGNOSIS — E288 Other ovarian dysfunction: Secondary | ICD-10-CM | POA: Diagnosis not present

## 2022-04-21 DIAGNOSIS — M25672 Stiffness of left ankle, not elsewhere classified: Secondary | ICD-10-CM | POA: Diagnosis not present

## 2022-04-21 DIAGNOSIS — M6281 Muscle weakness (generalized): Secondary | ICD-10-CM | POA: Diagnosis not present

## 2022-04-21 DIAGNOSIS — Z9889 Other specified postprocedural states: Secondary | ICD-10-CM | POA: Diagnosis not present

## 2022-04-24 DIAGNOSIS — E288 Other ovarian dysfunction: Secondary | ICD-10-CM | POA: Diagnosis not present

## 2022-04-28 DIAGNOSIS — M6281 Muscle weakness (generalized): Secondary | ICD-10-CM | POA: Diagnosis not present

## 2022-04-28 DIAGNOSIS — Z9889 Other specified postprocedural states: Secondary | ICD-10-CM | POA: Diagnosis not present

## 2022-04-28 DIAGNOSIS — M25672 Stiffness of left ankle, not elsewhere classified: Secondary | ICD-10-CM | POA: Diagnosis not present

## 2022-04-28 DIAGNOSIS — E288 Other ovarian dysfunction: Secondary | ICD-10-CM | POA: Diagnosis not present

## 2022-04-30 DIAGNOSIS — N979 Female infertility, unspecified: Secondary | ICD-10-CM | POA: Diagnosis not present

## 2022-04-30 DIAGNOSIS — E288 Other ovarian dysfunction: Secondary | ICD-10-CM | POA: Diagnosis not present

## 2022-04-30 DIAGNOSIS — Z3181 Encounter for male factor infertility in female patient: Secondary | ICD-10-CM | POA: Diagnosis not present

## 2022-05-01 DIAGNOSIS — F411 Generalized anxiety disorder: Secondary | ICD-10-CM | POA: Diagnosis not present

## 2022-05-01 DIAGNOSIS — F4312 Post-traumatic stress disorder, chronic: Secondary | ICD-10-CM | POA: Diagnosis not present

## 2022-05-06 DIAGNOSIS — M6281 Muscle weakness (generalized): Secondary | ICD-10-CM | POA: Diagnosis not present

## 2022-05-06 DIAGNOSIS — F411 Generalized anxiety disorder: Secondary | ICD-10-CM | POA: Diagnosis not present

## 2022-05-06 DIAGNOSIS — M25672 Stiffness of left ankle, not elsewhere classified: Secondary | ICD-10-CM | POA: Diagnosis not present

## 2022-05-06 DIAGNOSIS — Z9889 Other specified postprocedural states: Secondary | ICD-10-CM | POA: Diagnosis not present

## 2022-05-09 DIAGNOSIS — T8189XD Other complications of procedures, not elsewhere classified, subsequent encounter: Secondary | ICD-10-CM | POA: Diagnosis not present

## 2022-05-09 DIAGNOSIS — S86012D Strain of left Achilles tendon, subsequent encounter: Secondary | ICD-10-CM | POA: Diagnosis not present

## 2022-05-09 DIAGNOSIS — D2372 Other benign neoplasm of skin of left lower limb, including hip: Secondary | ICD-10-CM | POA: Diagnosis not present

## 2022-05-09 DIAGNOSIS — M7731 Calcaneal spur, right foot: Secondary | ICD-10-CM | POA: Diagnosis not present

## 2022-05-13 ENCOUNTER — Other Ambulatory Visit: Payer: Self-pay | Admitting: Podiatry

## 2022-05-13 ENCOUNTER — Encounter: Payer: Self-pay | Admitting: Podiatry

## 2022-05-14 NOTE — Discharge Instructions (Signed)
[facility]  POST OPERATIVE INSTRUCTIONS FOR DR. FOWLER AND DR[facility] PODIATRY [facility]PARTMENT   Take your medication as prescribed.  Pain medication should be taken only as needed.  Keep the dressing clean, dry and intact.  Keep your foot elevated above the heart level for the first 48 hours.  Walking to the bathroom and brief periods of walking are acceptable, unless we have instructed you to be non-weight bearing.  Always wear your post-op shoe when walking.  Always use your crutches if you are to be non-weight bearing.  Do not take a shower. Baths are permissible as long as the foot is kept out of the water.   Every hour you are awake:  Bend your knee 15 times. Flex foot 15 times Massage calf 15 times  Call [facility] ([phone]) if any of the following problems occur: You develop a temperature or fever. The bandage becomes saturated with blood. Medication does not stop your pain. Injury of the foot occurs. Any symptoms of infection including redness, odor, or red streaks running from wound. 

## 2022-05-15 ENCOUNTER — Encounter: Payer: Self-pay | Admitting: Podiatry

## 2022-05-15 ENCOUNTER — Other Ambulatory Visit: Payer: Self-pay

## 2022-05-15 ENCOUNTER — Ambulatory Visit
Admission: RE | Admit: 2022-05-15 | Discharge: 2022-05-15 | Disposition: A | Discharge status: Home / Self Care | Payer: BC Managed Care – PPO | Attending: Podiatry | Admitting: Podiatry

## 2022-05-15 ENCOUNTER — Encounter
Admission: RE | Disposition: A | Discharge status: Home / Self Care | Payer: Self-pay | Source: Home / Self Care | Attending: Podiatry

## 2022-05-15 ENCOUNTER — Ambulatory Visit: Payer: BC Managed Care – PPO | Admitting: General Practice

## 2022-05-15 DIAGNOSIS — Z6834 Body mass index (BMI) 34.0-34.9, adult: Secondary | ICD-10-CM | POA: Diagnosis not present

## 2022-05-15 DIAGNOSIS — F418 Other specified anxiety disorders: Secondary | ICD-10-CM | POA: Diagnosis not present

## 2022-05-15 DIAGNOSIS — Z87891 Personal history of nicotine dependence: Secondary | ICD-10-CM | POA: Insufficient documentation

## 2022-05-15 DIAGNOSIS — F17200 Nicotine dependence, unspecified, uncomplicated: Secondary | ICD-10-CM

## 2022-05-15 DIAGNOSIS — L919 Hypertrophic disorder of the skin, unspecified: Secondary | ICD-10-CM | POA: Diagnosis not present

## 2022-05-15 DIAGNOSIS — E669 Obesity, unspecified: Secondary | ICD-10-CM | POA: Diagnosis not present

## 2022-05-15 DIAGNOSIS — R7303 Prediabetes: Secondary | ICD-10-CM

## 2022-05-15 DIAGNOSIS — L03116 Cellulitis of left lower limb: Secondary | ICD-10-CM | POA: Diagnosis not present

## 2022-05-15 DIAGNOSIS — L02612 Cutaneous abscess of left foot: Secondary | ICD-10-CM | POA: Insufficient documentation

## 2022-05-15 DIAGNOSIS — L83 Acanthosis nigricans: Secondary | ICD-10-CM | POA: Diagnosis not present

## 2022-05-15 DIAGNOSIS — J45909 Unspecified asthma, uncomplicated: Secondary | ICD-10-CM | POA: Insufficient documentation

## 2022-05-15 DIAGNOSIS — M7542 Impingement syndrome of left shoulder: Secondary | ICD-10-CM

## 2022-05-15 DIAGNOSIS — E559 Vitamin D deficiency, unspecified: Secondary | ICD-10-CM

## 2022-05-15 DIAGNOSIS — J4520 Mild intermittent asthma, uncomplicated: Secondary | ICD-10-CM

## 2022-05-15 DIAGNOSIS — L859 Epidermal thickening, unspecified: Secondary | ICD-10-CM | POA: Diagnosis not present

## 2022-05-15 DIAGNOSIS — F321 Major depressive disorder, single episode, moderate: Secondary | ICD-10-CM

## 2022-05-15 DIAGNOSIS — E538 Deficiency of other specified B group vitamins: Secondary | ICD-10-CM

## 2022-05-15 DIAGNOSIS — Z09 Encounter for follow-up examination after completed treatment for conditions other than malignant neoplasm: Secondary | ICD-10-CM

## 2022-05-15 HISTORY — PX: INCISION AND DRAINAGE: SHX5863

## 2022-05-15 LAB — POCT PREGNANCY, URINE: Preg Test, Ur: NEGATIVE

## 2022-05-15 SURGERY — INCISION AND DRAINAGE
Anesthesia: General | Site: Foot | Laterality: Left

## 2022-05-15 MED ORDER — PROMETHAZINE HCL 25 MG/ML IJ SOLN
6.2500 mg | INTRAMUSCULAR | Status: DC | PRN
  Administered 2022-05-15: 6.25 mg via INTRAVENOUS

## 2022-05-15 MED ORDER — ACETAMINOPHEN 500 MG PO TABS
1000.0000 mg | ORAL_TABLET | Freq: Once | ORAL | Status: AC
  Administered 2022-05-15: 1000 mg via ORAL

## 2022-05-15 MED ORDER — DEXAMETHASONE SODIUM PHOSPHATE 4 MG/ML IJ SOLN
INTRAMUSCULAR | Status: DC | PRN
  Administered 2022-05-15: 4 mg via INTRAVENOUS

## 2022-05-15 MED ORDER — FENTANYL CITRATE (PF) 100 MCG/2ML IJ SOLN
INTRAMUSCULAR | Status: DC | PRN
  Administered 2022-05-15: 100 ug via INTRAVENOUS

## 2022-05-15 MED ORDER — PROPOFOL 10 MG/ML IV BOLUS
INTRAVENOUS | Status: DC | PRN
  Administered 2022-05-15: 50 ug/kg/min via INTRAVENOUS
  Administered 2022-05-15: 75 mg via INTRAVENOUS

## 2022-05-15 MED ORDER — HYDROCODONE-ACETAMINOPHEN 5-325 MG PO TABS: 1.0000 | ORAL_TABLET | Freq: Four times a day (QID) | ORAL | 0 refills | Status: AC | PRN

## 2022-05-15 MED ORDER — LIDOCAINE HCL (CARDIAC) PF 100 MG/5ML IV SOSY
PREFILLED_SYRINGE | INTRAVENOUS | Status: DC | PRN
  Administered 2022-05-15: 100 mg via INTRATRACHEAL

## 2022-05-15 MED ORDER — FENTANYL CITRATE PF 50 MCG/ML IJ SOSY: 25.0000 ug | PREFILLED_SYRINGE | INTRAMUSCULAR | Status: DC | PRN

## 2022-05-15 MED ORDER — ROCURONIUM BROMIDE 100 MG/10ML IV SOLN
INTRAVENOUS | Status: DC | PRN
  Administered 2022-05-15: 30 mg via INTRAVENOUS

## 2022-05-15 MED ORDER — SUGAMMADEX SODIUM 200 MG/2ML IV SOLN
INTRAVENOUS | Status: DC | PRN
  Administered 2022-05-15: 200 mg via INTRAVENOUS

## 2022-05-15 MED ORDER — ACETAMINOPHEN 160 MG/5ML PO SOLN: 960.0000 mg | Freq: Once | ORAL | Status: AC

## 2022-05-15 MED ORDER — MIDAZOLAM HCL 5 MG/5ML IJ SOLN
INTRAMUSCULAR | Status: DC | PRN
  Administered 2022-05-15: 2 mg via INTRAVENOUS

## 2022-05-15 MED ORDER — LACTATED RINGERS IV SOLN: INTRAVENOUS | Status: DC

## 2022-05-15 MED ORDER — KETOROLAC TROMETHAMINE 15 MG/ML IJ SOLN
INTRAMUSCULAR | Status: DC | PRN
  Administered 2022-05-15: 30 mg via INTRAVENOUS

## 2022-05-15 MED ORDER — DEXMEDETOMIDINE HCL IN NACL 80 MCG/20ML IV SOLN
INTRAVENOUS | Status: DC | PRN
  Administered 2022-05-15: 4 ug via BUCCAL

## 2022-05-15 MED ORDER — BUPIVACAINE HCL 0.5 % IJ SOLN
INTRAMUSCULAR | Status: DC | PRN
  Administered 2022-05-15: 10 mL

## 2022-05-15 MED ORDER — ONDANSETRON HCL 4 MG/2ML IJ SOLN
INTRAMUSCULAR | Status: DC | PRN
  Administered 2022-05-15: 4 mg via INTRAVENOUS

## 2022-05-15 MED ORDER — CEFAZOLIN SODIUM-DEXTROSE 2-4 GM/100ML-% IV SOLN
2.0000 g | INTRAVENOUS | Status: AC
  Administered 2022-05-15: 2 g via INTRAVENOUS

## 2022-05-15 SURGICAL SUPPLY — 28 items
BNDG CMPR 75X41 PLY HI ABS (GAUZE/BANDAGES/DRESSINGS) ×1
BNDG ELASTIC 4X5.8 VLCR STR LF (GAUZE/BANDAGES/DRESSINGS) ×1 IMPLANT
BNDG ESMARK 4X12 TAN STRL LF (GAUZE/BANDAGES/DRESSINGS) ×1 IMPLANT
BNDG STRETCH 4X75 STRL LF (GAUZE/BANDAGES/DRESSINGS) ×1 IMPLANT
CANISTER SUCT 1200ML W/VALVE (MISCELLANEOUS) ×1 IMPLANT
CUFF TOURN SGL QUICK 18X4 (TOURNIQUET CUFF) IMPLANT
DURAPREP 26ML APPLICATOR (WOUND CARE) ×1 IMPLANT
ELECT REM PT RETURN 9FT ADLT (ELECTROSURGICAL) ×1
ELECTRODE REM PT RTRN 9FT ADLT (ELECTROSURGICAL) ×1 IMPLANT
GAUZE SPONGE 4X4 12PLY STRL (GAUZE/BANDAGES/DRESSINGS) ×1 IMPLANT
GAUZE XEROFORM 1X8 LF (GAUZE/BANDAGES/DRESSINGS) ×1 IMPLANT
GLOVE SURG ENC MOIS LTX SZ7.5 (GLOVE) ×1 IMPLANT
GLOVE SURG UNDER LTX SZ8 (GLOVE) ×1 IMPLANT
GOWN STRL REUS W/TWL MED LVL3 (GOWN DISPOSABLE) ×2 IMPLANT
IV LACTATED RINGER IRRG 3000ML (IV SOLUTION) ×1
IV LR IRRIG 3000ML ARTHROMATIC (IV SOLUTION) ×1 IMPLANT
KIT TURNOVER KIT A (KITS) ×1 IMPLANT
MANIFOLD NEPTUNE II (INSTRUMENTS) ×1 IMPLANT
NS IRRIG 500ML POUR BTL (IV SOLUTION) ×1 IMPLANT
PACK EXTREMITY ARMC (MISCELLANEOUS) ×1 IMPLANT
PULSAVAC PLUS IRRIG FAN TIP (DISPOSABLE) ×1
STOCKINETTE IMPERVIOUS 9X36 MD (GAUZE/BANDAGES/DRESSINGS) ×1 IMPLANT
SUT ETHILON 3-0 FS-10 30 BLK (SUTURE) ×1
SUT ETHILON 4-0 (SUTURE) ×1
SUT ETHILON 4-0 FS2 18XMFL BLK (SUTURE) ×1
SUTURE EHLN 3-0 FS-10 30 BLK (SUTURE) ×1 IMPLANT
SUTURE ETHLN 4-0 FS2 18XMF BLK (SUTURE) ×1 IMPLANT
TIP FAN IRRIG PULSAVAC PLUS (DISPOSABLE) ×1 IMPLANT

## 2022-05-15 NOTE — Anesthesia Postprocedure Evaluation (Signed)
Anesthesia Post Note  Patient: Evelyn Rangel  Procedure(s) Performed: INCISION AND DRAINAGE (Left: Foot)  Patient location during evaluation: PACU Anesthesia Type: General Level of consciousness: awake and alert Pain management: pain level controlled Vital Signs Assessment: post-procedure vital signs reviewed and stable Respiratory status: spontaneous breathing, nonlabored ventilation, respiratory function stable and patient connected to nasal cannula oxygen Cardiovascular status: blood pressure returned to baseline and stable Postop Assessment: no apparent nausea or vomiting Anesthetic complications: no   There were no known notable events for this encounter.   Last Vitals:  Vitals:   05/15/22 0900 05/15/22 0911  BP: 115/70 116/72  Pulse: 73 77  Resp: 13 15  Temp:  36.4 C  SpO2: 99% 99%    Last Pain:  Vitals:   05/15/22 0911  TempSrc:   PainSc: 0-No pain                 Martha Clan

## 2022-05-15 NOTE — Anesthesia Preprocedure Evaluation (Signed)
Anesthesia Evaluation  Patient identified by MRN, date of birth, ID band Patient awake    Reviewed: Allergy & Precautions, NPO status   History of Anesthesia Complications Negative for: history of anesthetic complications  Airway Mallampati: II  TM Distance: >3 FB     Dental  (+) Dental Advidsory Given   Pulmonary neg shortness of breath, asthma , neg sleep apnea, neg recent URI, former smoker,    Pulmonary exam normal        Cardiovascular Exercise Tolerance: Good negative cardio ROS   Rhythm:Regular Rate:Normal     Neuro/Psych PSYCHIATRIC DISORDERS Anxiety Depression negative neurological ROS     GI/Hepatic negative GI ROS, Neg liver ROS,   Endo/Other  negative endocrine ROSObesity - BMI > 30  Renal/GU negative Renal ROS     Musculoskeletal  (+) Arthritis ,   Abdominal   Peds  Hematology   Anesthesia Other Findings Past Medical History: No date: Allergy     Comment:  seasonal No date: Anxiety No date: Asthma     Comment:  years since attack, allergy based   Reproductive/Obstetrics                             Anesthesia Physical  Anesthesia Plan  ASA: 2  Anesthesia Plan: General   Post-op Pain Management:    Induction: Intravenous  PONV Risk Score and Plan: Treatment may vary due to age or medical condition, Ondansetron, Dexamethasone and Midazolam  Airway Management Planned: Oral ETT  Additional Equipment:   Intra-op Plan:   Post-operative Plan: Extubation in OR  Informed Consent: I have reviewed the patients History and Physical, chart, labs and discussed the procedure including the risks, benefits and alternatives for the proposed anesthesia with the patient or authorized representative who has indicated his/her understanding and acceptance.     Dental advisory given  Plan Discussed with: CRNA  Anesthesia Plan Comments:         Anesthesia Quick  Evaluation

## 2022-05-15 NOTE — Anesthesia Procedure Notes (Signed)
Procedure Name: Intubation Date/Time: 05/15/2022 8:03 AM  Performed by: Patience Musca., CRNAPre-anesthesia Checklist: Patient identified, Patient being monitored, Timeout performed, Emergency Drugs available and Suction available Patient Re-evaluated:Patient Re-evaluated prior to induction Oxygen Delivery Method: Circle system utilized Preoxygenation: Pre-oxygenation with 100% oxygen Induction Type: IV induction Ventilation: Mask ventilation without difficulty Laryngoscope Size: 3 and McGraph Grade View: Grade I Tube type: Oral Tube size: 7.0 mm Number of attempts: 1 Airway Equipment and Method: Stylet Placement Confirmation: ETT inserted through vocal cords under direct vision, positive ETCO2 and breath sounds checked- equal and bilateral Secured at: 21 cm Tube secured with: Tape Dental Injury: Teeth and Oropharynx as per pre-operative assessment

## 2022-05-15 NOTE — H&P (Signed)
HISTORY AND PHYSICAL INTERVAL NOTE:  05/15/2022  7:13 AM  Evelyn Rangel  has presented today for surgery, with the diagnosis of L02.612 - Abscess of foot, cutaneous.  The various methods of treatment have been discussed with the patient.  No guarantees were given.  After consideration of risks, benefits and other options for treatment, the patient has consented to surgery.  I have reviewed the patients' chart and labs.    PROCEDURE: LEFT HEEL INCISION AND DRAINAGE WITH POSSIBLE REMOVAL OF SUTURE MATERIAL/IMPLANTED MATERIAL   A history and physical examination was performed in my office.  The patient was reexamined.  There have been no changes to this history and physical examination.  Caroline More, DPM

## 2022-05-15 NOTE — Transfer of Care (Signed)
Immediate Anesthesia Transfer of Care Note  Patient: Evelyn Rangel  Procedure(s) Performed: INCISION AND DRAINAGE (Left: Foot)  Patient Location: PACU  Anesthesia Type: General  Level of Consciousness: awake, alert  and patient cooperative  Airway and Oxygen Therapy: Patient Spontanous Breathing and Patient connected to supplemental oxygen  Post-op Assessment: Post-op Vital signs reviewed, Patient's Cardiovascular Status Stable, Respiratory Function Stable, Patent Airway and No signs of Nausea or vomiting  Post-op Vital Signs: Reviewed and stable  Complications: There were no known notable events for this encounter.

## 2022-05-16 ENCOUNTER — Encounter: Payer: Self-pay | Admitting: Podiatry

## 2022-05-16 NOTE — Op Note (Signed)
PODIATRY / FOOT AND ANKLE SURGERY OPERATIVE REPORT    SURGEON: Caroline More, DPM  PRE-OPERATIVE DIAGNOSIS:  1.  Left foot suture reaction status post Achilles repair  POST-OPERATIVE DIAGNOSIS: Same  PROCEDURE(S): Left foot incision and drainage with removal of suture material/implant material  HEMOSTASIS: Left high ankle tourniquet  ANESTHESIA: general  ESTIMATED BLOOD LOSS: 5 cc  FINDING(S): 1.  After incision was made around the area of the suture reaction, did reveal some suture material deep to the area within the subcutaneous tissue coming from the suture anchor.  This tissue was all removed down to the level of the anchor.  No signs of infection.  PATHOLOGY/SPECIMEN(S): Left foot suture material  INDICATIONS:   Lyzette Reinhardt is a 31 y.o. female who presents with pain to the posterior lateral aspect the left heel 6 months after undergoing a percutaneous Achilles tendon repair with suture anchor placement.  Patient was seen by physical therapy and noted to have a prominence present at the posterior lateral aspect of the elbow with concern for potential abscess.  Upon further examination and x-ray imaging it appears to most likely be a suture reaction or suture abscess potentially.  Patient was treated conservatively with paring of the area as well as antibiotic therapy but continued to have pain discomfort.  All treatment options were discussed with the patient both conservative and surgical attempts at correction include potential risks and complications and at this time patient is elected for surgical procedure described below.  Consent obtained prior to procedure, no guarantees given.  Discussed that if suture material is removed we will try to keep the anchor intact as the anchor will still be providing support to the Achilles tendon.  If patient has subsequent reaction with the anchor then would have to remove the entire product from the area which could compromise some of the  repair.  Patient understands and is agreeable.  DESCRIPTION: After obtaining full informed written consent, the patient was brought back to the operating room and placed prone upon the operating table with the appropriate padding after sedation.  The patient received IV antibiotics prior to induction.  After obtaining adequate anesthesia, the patient was prepped and draped in the standard fashion.  A preoperative block was then performed with 10 cc of half percent Marcaine plain about the operative area.  Attention was then directed to the posterior lateral aspect the left heel in the area of the nucleated skin lesion.  A 3-1 type of elliptical incision was made around this area and the skin was ellipsed along with subcutaneous tissue and passed off the operative site and sent off to pathology.  Further deep debridement was performed to this area revealing some suture from the anchor that was placed for the Achilles repair.  This appeared to be fairly deep and very close to the anchor but did have some slight prominence.  The suture was resected further to the level of the anchor.  There did not appear to be any purulent discharge or any signs of infection to the area.  A curette was used to the area to debride tissues further.  There did not appear to be any evidence of infection to the area.  The anchor appeared to be sitting flush to the bone and did not have any further palpable suture present.  The suture material was also passed off the operative site and sent off to pathology along with the elliptical skin piece.  The surgical site was flushed with copious amounts  normal sterile saline.  The skin edges were then reapproximated well coapted with 3-0 nylon in simple type stitching.  A postoperative dressing was then applied consisting of Xeroform followed by 4 x 4 gauze, ABD pad, Kerlix, Ace wrap.  The pneumatic ankle tourniquet was deflated and a prompt hyperemic response was noted all digits of the left  foot.  The patient tolerated the procedure and anesthesia well was transferred to recovery room vital signs stable vascular status intact all toes left foot.  Following a period of postoperative monitoring the patient be discharged with propria orders, follow-up instructions, and weightbearing status, patient to be placed in surgical shoe and keep dressings clean, dry, and intact until postoperative visit #1 in clinic about a week from surgery.  Discussed intraoperative findings with family postoperatively.  Discussed that if she has any further reaction to the area, would require permanent hardware removal which would be a much larger surgery and could compromise some of the Achilles repair that was done previously.  We will closely observe this area over time.  Patient and family understand.  COMPLICATIONS: None  CONDITION: Good, stable  Rosetta Posner, DPM

## 2022-05-19 LAB — SURGICAL PATHOLOGY

## 2022-05-29 DIAGNOSIS — E288 Other ovarian dysfunction: Secondary | ICD-10-CM | POA: Diagnosis not present

## 2022-05-29 DIAGNOSIS — N979 Female infertility, unspecified: Secondary | ICD-10-CM | POA: Diagnosis not present

## 2022-06-02 DIAGNOSIS — E288 Other ovarian dysfunction: Secondary | ICD-10-CM | POA: Diagnosis not present

## 2022-06-02 DIAGNOSIS — N979 Female infertility, unspecified: Secondary | ICD-10-CM | POA: Diagnosis not present

## 2022-06-03 DIAGNOSIS — M25672 Stiffness of left ankle, not elsewhere classified: Secondary | ICD-10-CM | POA: Diagnosis not present

## 2022-06-03 DIAGNOSIS — Z9889 Other specified postprocedural states: Secondary | ICD-10-CM | POA: Diagnosis not present

## 2022-06-03 DIAGNOSIS — M6281 Muscle weakness (generalized): Secondary | ICD-10-CM | POA: Diagnosis not present

## 2022-06-04 DIAGNOSIS — E288 Other ovarian dysfunction: Secondary | ICD-10-CM | POA: Diagnosis not present

## 2022-06-04 DIAGNOSIS — N979 Female infertility, unspecified: Secondary | ICD-10-CM | POA: Diagnosis not present

## 2022-06-06 ENCOUNTER — Ambulatory Visit (INDEPENDENT_AMBULATORY_CARE_PROVIDER_SITE_OTHER): Payer: Self-pay | Admitting: Internal Medicine

## 2022-06-06 ENCOUNTER — Encounter: Payer: Self-pay | Admitting: Internal Medicine

## 2022-06-06 VITALS — BP 120/78 | HR 116 | Ht 65.0 in | Wt 212.0 lb

## 2022-06-06 DIAGNOSIS — J4521 Mild intermittent asthma with (acute) exacerbation: Secondary | ICD-10-CM

## 2022-06-06 DIAGNOSIS — J40 Bronchitis, not specified as acute or chronic: Secondary | ICD-10-CM

## 2022-06-06 MED ORDER — ALBUTEROL SULFATE HFA 108 (90 BASE) MCG/ACT IN AERS: 2.0000 | INHALATION_SPRAY | Freq: Four times a day (QID) | RESPIRATORY_TRACT | 1 refills | Status: AC | PRN

## 2022-06-06 MED ORDER — PROMETHAZINE-DM 6.25-15 MG/5ML PO SYRP: 5.0000 mL | ORAL_SOLUTION | Freq: Four times a day (QID) | ORAL | 0 refills | Status: AC | PRN

## 2022-06-06 MED ORDER — AMOXICILLIN 875 MG PO TABS: 875.0000 mg | ORAL_TABLET | Freq: Two times a day (BID) | ORAL | 0 refills | Status: AC

## 2022-06-06 MED ORDER — PREDNISONE 10 MG PO TABS: 10.0000 mg | ORAL_TABLET | ORAL | 0 refills | Status: AC

## 2022-06-06 NOTE — Progress Notes (Signed)
Date:  06/06/2022   Name:  Evelyn Rangel   DOB:  Dec 15, 1990   MRN:  [id number]   Chief Complaint: Cough  Cough This is a new problem. Episode onset: X2 weeks. The problem has been unchanged. The problem occurs every few minutes. The cough is Productive of sputum (yellow). Associated symptoms include ear congestion, headaches, heartburn, nasal congestion, postnasal drip, a sore throat, shortness of breath and wheezing. Pertinent negatives include no chest pain, chills or fever. Nothing aggravates the symptoms. She has tried OTC cough suppressant for the symptoms. The treatment provided mild relief.   Just had insemination 2 days ago and is not sure she can take steroids.  Lab Results  Component Value Date   NA 137 12/01/2019   K 4.0 12/01/2019   CO2 26 12/01/2019   GLUCOSE 89 12/01/2019   BUN 12 12/01/2019   CREATININE 0.82 12/01/2019   CALCIUM 9.4 12/01/2019   GFRNONAA >60 12/01/2019   Lab Results  Component Value Date   CHOL 216 (H) 10/13/2019   HDL 44 10/13/2019   LDLCALC 149 (H) 10/13/2019   TRIG 125 10/13/2019   CHOLHDL 4.9 (H) 10/13/2019   Lab Results  Component Value Date   TSH 2.80 06/09/2019   Lab Results  Component Value Date   HGBA1C 5.5 10/13/2019   Lab Results  Component Value Date   WBC 9.7 12/01/2019   HGB 14.0 12/01/2019   HCT 41.7 12/01/2019   MCV 84.4 12/01/2019   PLT 432 (H) 12/01/2019   Lab Results  Component Value Date   ALT 32 12/01/2019   AST 26 12/01/2019   ALKPHOS 76 12/01/2019   BILITOT 0.5 12/01/2019   Lab Results  Component Value Date   VD25OH 32.7 10/13/2019     Review of Systems  Constitutional:  Negative for chills, fatigue and fever.  HENT:  Positive for postnasal drip and sore throat.   Respiratory:  Positive for cough, shortness of breath and wheezing.   Cardiovascular:  Negative for chest pain and palpitations.  Gastrointestinal:  Positive for heartburn.  Musculoskeletal:  Positive for arthralgias (s/p achilles tendon  repair).  Neurological:  Positive for headaches.  Psychiatric/Behavioral:  Negative for dysphoric mood and sleep disturbance. The patient is not nervous/anxious.     Patient Active Problem List   Diagnosis Date Noted   Current moderate 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 prior episode ([place]) 10/13/2019   Mild intermittent asthma without complication [id number]   Prediabetes 06/14/2019   Vitamin D deficiency 06/14/2019   Vitamin B12 deficiency 06/14/2019   Tobacco use disorder 06/14/2019   Impingement syndrome of left shoulder 06/14/2019    Allergies  Allergen Reactions   Erythromycin Hives    Past Surgical History:  Procedure Laterality Date   ACHILLES TENDON SURGERY Left 11/07/2021   Procedure: ACHILLES TENDON REPAIR;  Surgeon: Caroline More, DPM;  Location: [place];  Service: Podiatry;  Laterality: Left;   INCISION AND DRAINAGE Left 05/15/2022   Procedure: INCISION AND DRAINAGE;  Surgeon: Caroline More, DPM;  Location: [place];  Service: Podiatry;  Laterality: Left;   lymph node removal     TONSILECTOMY/ADENOIDECTOMY WITH MYRINGOTOMY  2012   TONSILLECTOMY     TYMPANOSTOMY TUBE PLACEMENT     WISDOM TOOTH EXTRACTION  2009    Social History   Tobacco Use   Smoking status: Former    Packs/day: 0.50    Years: 7.00    Total pack years: 3.50    Types: Cigarettes  Quit date: 06/19/2021    Years since quitting: 0.9   Smokeless tobacco: Never  Vaping Use   Vaping Use: Some days  Substance Use Topics   Alcohol use: Yes    Alcohol/week: 1.0 standard drink of alcohol    Types: 1 Standard drinks or equivalent per week   Drug use: Never     Medication list has been reviewed and updated.  Current Meds  Medication Sig   albuterol (VENTOLIN HFA) 108 (90 Base) MCG/ACT inhaler Inhale into the lungs every 6 (six) hours as needed for wheezing or shortness of breath.   amitriptyline (ELAVIL) 25 MG tablet Take 50 mg by mouth at bedtime.    clomiPHENE (CLOMID) 50 MG tablet Take 100 mg by mouth daily. Daily on days 3-7 of cycle   Multiple Vitamins-Minerals (MULTIVITAMIN WITH MINERALS) tablet Take 1 tablet by mouth daily.   OVIDREL 250 MCG/0.5ML injection SMARTSIG:1 Syringe(s) SUB-Q   sertraline (ZOLOFT) 25 MG tablet Take 100 mg by mouth at bedtime. Dr. Madelaine Bhat   [DISCONTINUED] ondansetron (ZOFRAN) 4 MG tablet Take 1 tablet (4 mg total) by mouth every 8 (eight) hours as needed for nausea or vomiting.       05/10/2021    3:48 PM 08/07/2020   11:23 AM 07/09/2020   11:07 AM  GAD 7 : Generalized Anxiety Score  Nervous, Anxious, on Edge 1 0 0  Control/stop worrying 1 0 0  Worry too much - different things 0 0 0  Trouble relaxing 1 0 0  Restless 0 0 0  Easily annoyed or irritable 0 0 0  Afraid - awful might happen 0 0 0  Total GAD 7 Score 3 0 0  Anxiety Difficulty Not difficult at all Not difficult at all        05/10/2021    3:47 PM 08/07/2020   11:23 AM 07/09/2020   11:07 AM  Depression screen PHQ 2/9  Decreased Interest 0 0 0  Down, Depressed, Hopeless 0 0 0  PHQ - 2 Score 0 0 0  Altered sleeping 0 0 0  Tired, decreased energy 0 0 0  Change in appetite 0 0 0  Feeling bad or failure about yourself  0 0 0  Trouble concentrating 0 0 0  Moving slowly or fidgety/restless 0 0 0  Suicidal thoughts 0 0 0  PHQ-9 Score 0 0 0  Difficult doing work/chores Not difficult at all Not difficult at all     BP Readings from Last 3 Encounters:  06/06/22 120/78  05/15/22 116/72  11/07/21 123/89    Physical Exam Vitals and nursing note reviewed.  Constitutional:      General: She is not in acute distress.    Appearance: Normal appearance. She is well-developed.  HENT:     Head: Normocephalic and atraumatic.     Right Ear: Tympanic membrane normal.     Left Ear: Tympanic membrane normal.     Nose:     Right Sinus: Maxillary sinus tenderness present.     Left Sinus: Maxillary sinus tenderness present.      Mouth/Throat:     Pharynx: No posterior oropharyngeal erythema.  Cardiovascular:     Rate and Rhythm: Normal rate and regular rhythm. No extrasystoles are present. Pulmonary:     Effort: Pulmonary effort is normal. No accessory muscle usage, prolonged expiration or respiratory distress.     Breath sounds: No stridor. Examination of the right-upper field reveals wheezing. Examination of the left-upper field reveals wheezing. Wheezing present.  Skin:    General: Skin is warm and dry.     Findings: No rash.  Neurological:     Mental Status: She is alert and oriented to person, place, and time.  Psychiatric:        Mood and Affect: Mood normal.        Behavior: Behavior normal.     Wt Readings from Last 3 Encounters:  06/06/22 212 lb (96.2 kg)  05/15/22 208 lb (94.3 kg)  11/07/21 203 lb (92.1 kg)    BP 120/78   Pulse (!) 116   Ht 5\' 5"  (1.651 m)   Wt 212 lb (96.2 kg)   LMP 04/13/2022 (Exact Date)   SpO2 98%   BMI 35.28 kg/m   Assessment and Plan: 1. Mild intermittent asthma with exacerbation Take steroid taper if allowed by IVF MD Otherwise use albuterol and cough syrup as needed - predniSONE (DELTASONE) 10 MG tablet; Take 1 tablet (10 mg total) by mouth as directed for 6 days. Take 6,5,4,3,2,1 then stop  Dispense: 21 tablet; Refill: 0 - promethazine-dextromethorphan (PROMETHAZINE-DM) 6.25-15 MG/5ML syrup; Take 5 mLs by mouth 4 (four) times daily as needed for up to 9 days for cough.  Dispense: 118 mL; Refill: 0 - albuterol (VENTOLIN HFA) 108 (90 Base) MCG/ACT inhaler; Inhale 2 puffs into the lungs every 6 (six) hours as needed for wheezing or shortness of breath.  Dispense: 18 g; Refill: 1  2. Bronchitis - amoxicillin (AMOXIL) 875 MG tablet; Take 1 tablet (875 mg total) by mouth 2 (two) times daily for 10 days.  Dispense: 20 tablet; Refill: 0   Partially dictated using 09-[birth year]-1990. Any errors are unintentional.  Animal nutritionist, MD [facility]  06/06/2022

## 2022-06-09 ENCOUNTER — Ambulatory Visit: Payer: Self-pay | Admitting: Internal Medicine

## 2022-06-11 DIAGNOSIS — M25672 Stiffness of left ankle, not elsewhere classified: Secondary | ICD-10-CM | POA: Diagnosis not present

## 2022-06-11 DIAGNOSIS — Z9889 Other specified postprocedural states: Secondary | ICD-10-CM | POA: Diagnosis not present

## 2022-06-11 DIAGNOSIS — M6281 Muscle weakness (generalized): Secondary | ICD-10-CM | POA: Diagnosis not present

## 2022-06-13 DIAGNOSIS — B3731 Acute candidiasis of vulva and vagina: Secondary | ICD-10-CM | POA: Diagnosis not present

## 2022-06-13 DIAGNOSIS — N760 Acute vaginitis: Secondary | ICD-10-CM | POA: Diagnosis not present

## 2022-06-24 DIAGNOSIS — Z3181 Encounter for male factor infertility in female patient: Secondary | ICD-10-CM | POA: Diagnosis not present

## 2022-06-24 DIAGNOSIS — E288 Other ovarian dysfunction: Secondary | ICD-10-CM | POA: Diagnosis not present

## 2022-06-24 DIAGNOSIS — N979 Female infertility, unspecified: Secondary | ICD-10-CM | POA: Diagnosis not present

## 2022-06-30 DIAGNOSIS — F411 Generalized anxiety disorder: Secondary | ICD-10-CM | POA: Diagnosis not present

## 2022-07-09 DIAGNOSIS — Z9889 Other specified postprocedural states: Secondary | ICD-10-CM | POA: Diagnosis not present

## 2022-07-09 DIAGNOSIS — M6281 Muscle weakness (generalized): Secondary | ICD-10-CM | POA: Diagnosis not present

## 2022-07-09 DIAGNOSIS — M25672 Stiffness of left ankle, not elsewhere classified: Secondary | ICD-10-CM | POA: Diagnosis not present

## 2022-07-14 DIAGNOSIS — F411 Generalized anxiety disorder: Secondary | ICD-10-CM | POA: Diagnosis not present

## 2022-07-19 IMAGING — MR MR ANKLE*L* W/O CM
6 series · 40 of 40 positions shown · non-contrast
Comparison: None.

CLINICAL DATA: Left foot gave out while dancing [REDACTED]. Pain and
swelling around the left heel.

EXAM:
MRI OF THE LEFT ANKLE WITHOUT CONTRAST
TECHNIQUE: Multiplanar, multisequence MR imaging of the ankle was performed. No
intravenous contrast was administered.

[Series 4: T2 fat-sat · axial · left · 3.0mm · 0.50mm/px · z∈[-84,+56]mm · 8 of 36 slices shown]
[im 1/36]
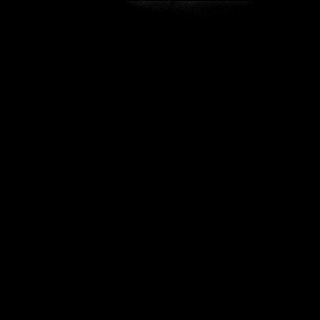
[im 6/36]
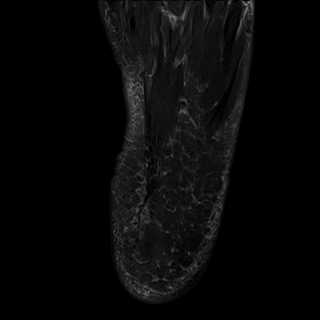
[im 11/36]
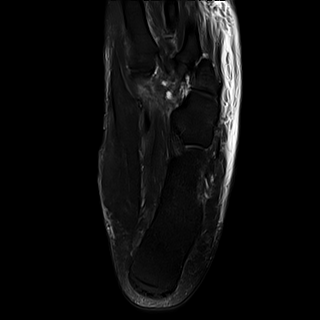
[im 16/36]
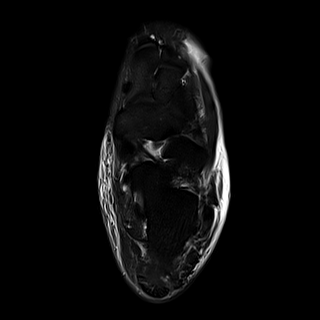
[im 21/36]
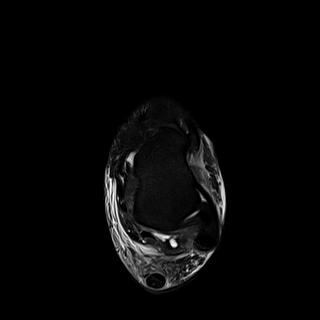
[im 26/36]
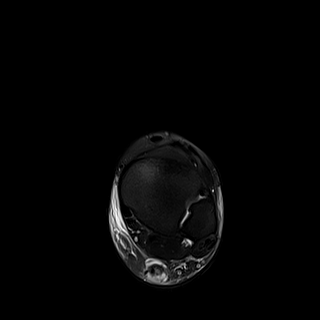
[im 31/36]
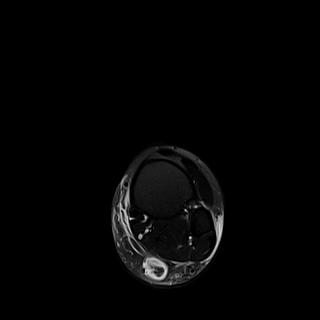
[im 36/36]
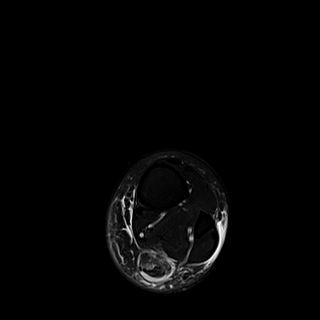

[Series 5: PD fat-sat · axial · left · 3.0mm · 0.50mm/px · z∈[-84,+56]mm · 8 of 36 slices shown]
[im 1/36]
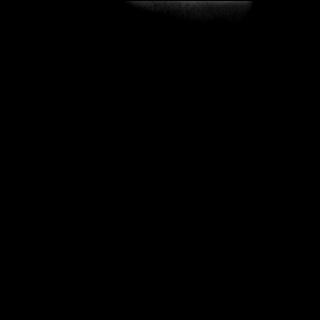
[im 6/36]
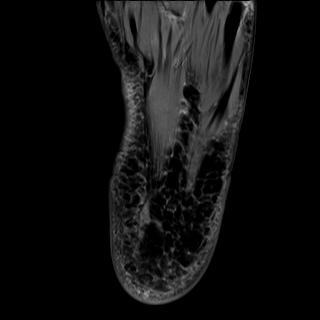
[im 11/36]
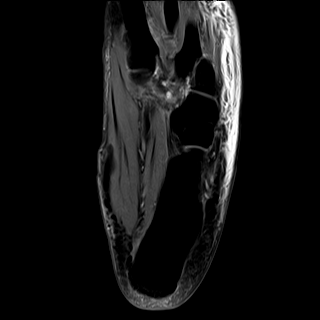
[im 16/36]
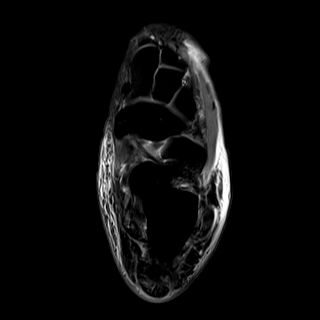
[im 21/36]
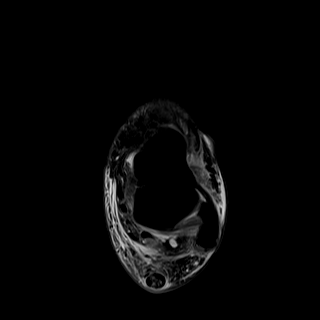
[im 26/36]
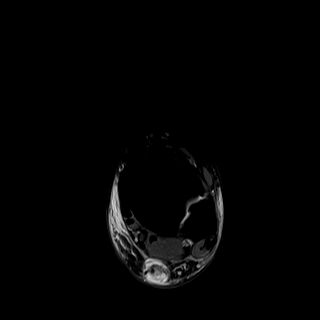
[im 31/36]
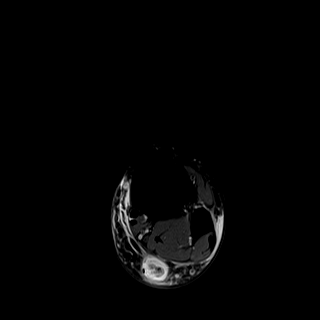
[im 36/36]
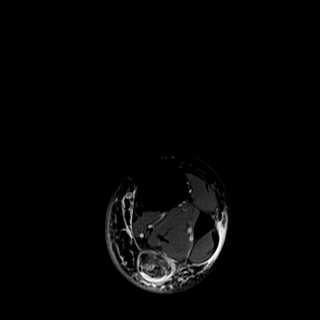

[Series 6: T2 · coronal · left · 3.0mm · 0.62mm/px · 8 of 40 slices shown (1 of 2)]
[im 1/40]
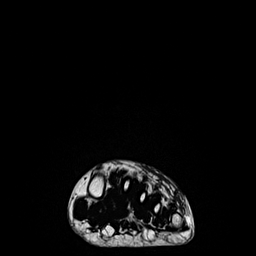
[im 6/40]
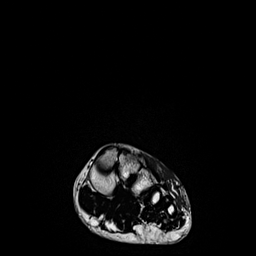
[im 12/40]
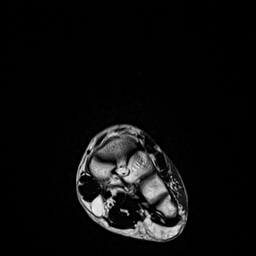
[im 17/40]
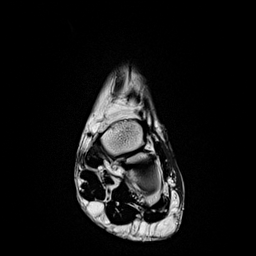
[im 23/40]
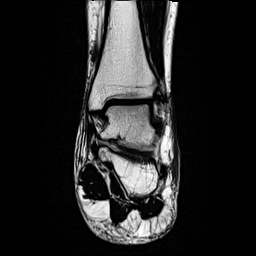
[im 28/40]
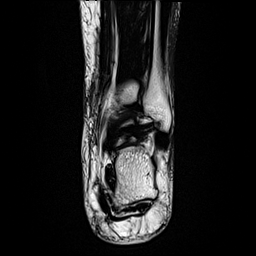
[im 34/40]
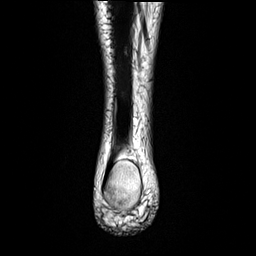
[im 40/40]
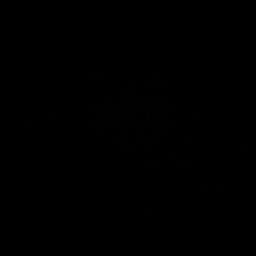

[Series 7: T2 · coronal · left · 3.0mm · 0.62mm/px · 8 of 40 slices shown (2 of 2)]
[im 1/40]
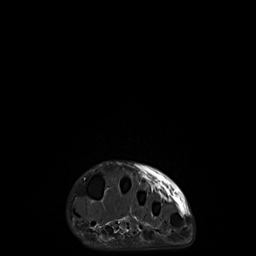
[im 6/40]
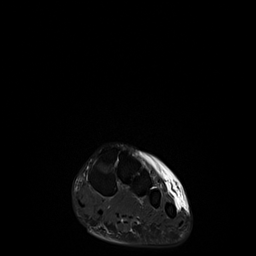
[im 12/40]
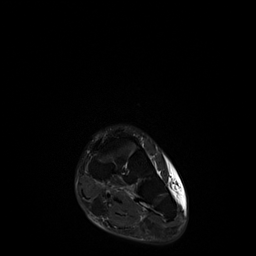
[im 17/40]
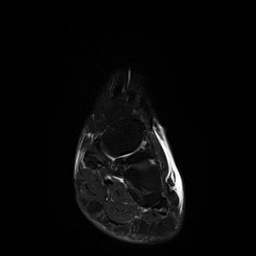
[im 23/40]
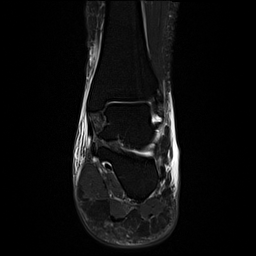
[im 28/40]
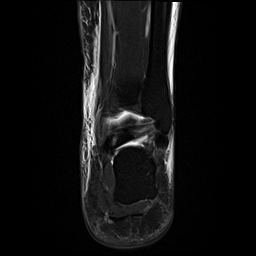
[im 34/40]
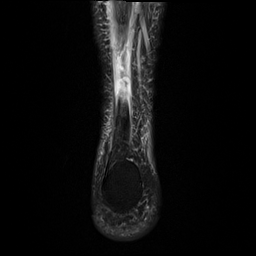
[im 40/40]
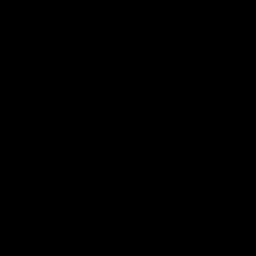

[Series 8: T1 · sagittal · left · 4.0mm · 0.70mm/px · 4 of 21 slices shown]
[im 1/21]
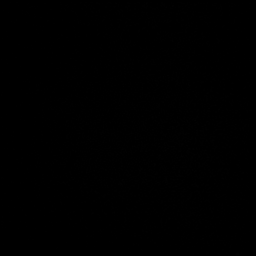
[im 7/21]
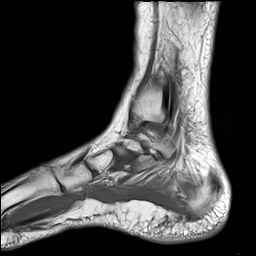
[im 14/21]
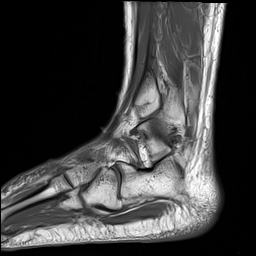
[im 21/21]
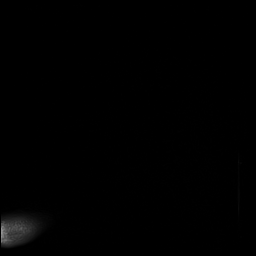

[Series 9: STIR · sagittal · left · 4.0mm · 0.35mm/px · 4 of 21 slices shown]
[im 1/21]
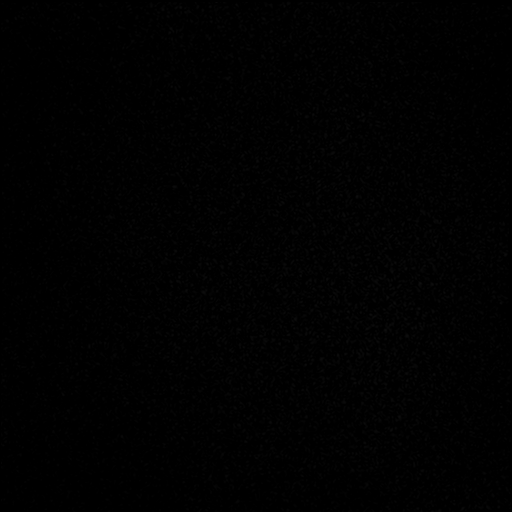
[im 7/21]
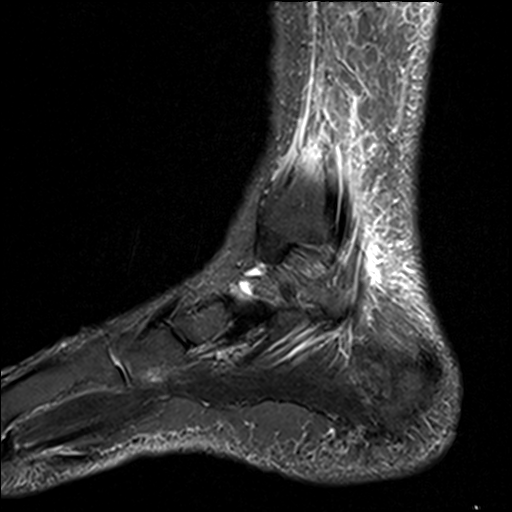
[im 14/21]
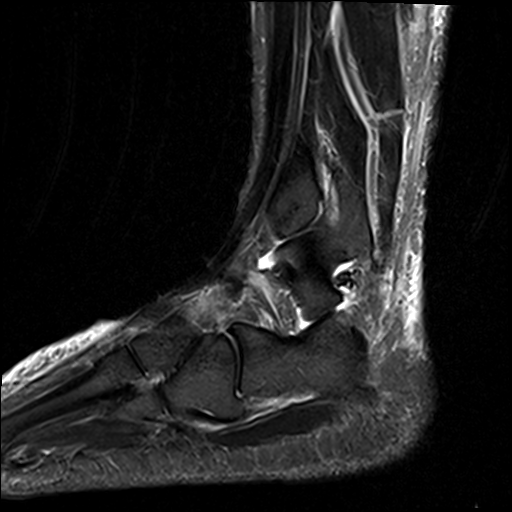
[im 21/21]
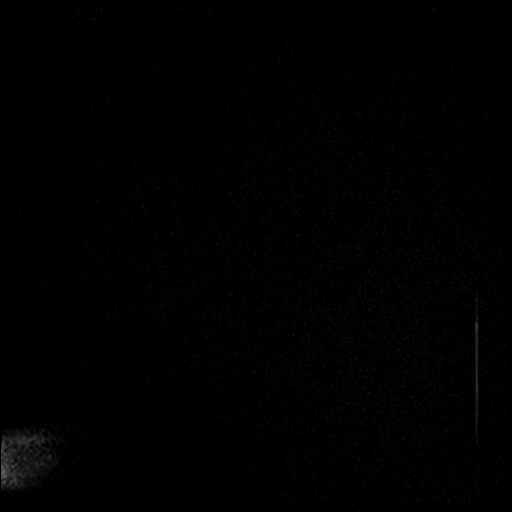

[40 of 40 positions shown; findings below may reference images not displayed]

FINDINGS: TENDONS

Peroneal: Thickening and tendinopathy of the peroneus brevis without
discrete tear. Peroneus longus is intact.

Posteromedial: Posterior tibial tendon intact. Flexor hallucis
longus tendon intact. Flexor digitorum longus tendon intact.

Anterior: Tibialis anterior tendon intact. Extensor hallucis longus
tendon intact Extensor digitorum longus tendon intact.

Achilles: Advanced tendinopathy of the Achilles tendon. There is
full-thickness Achilles tear approximately 6 cm proximal to its
insertion. There is approximately 1.7 cm tendon retraction with
fluid signal between the tendon segments.

Plantar Fascia: Intact.

LIGAMENTS

Lateral: Anterior talofibular ligament intact. Calcaneofibular
ligament intact. Posterior talofibular ligament intact. Anterior and
posterior tibiofibular ligaments intact.

Medial: Deltoid ligament intact. Spring ligament intact.

CARTILAGE

Ankle Joint: No joint effusion. Normal ankle mortise. No chondral
defect.

Subtalar Joints/Sinus Tarsi: Normal subtalar joints. No subtalar
joint effusion. Normal sinus tarsi.

Bones: No marrow signal abnormality.  No fracture or dislocation.

Soft Tissue: Soft tissue edema about the posterior aspect of the
distal leg as well as medial and lateral aspect of the ankle.
Muscles are normal without edema or atrophy. Tarsal tunnel is
normal.
IMPRESSION: 1. Full-thickness Achilles tendon tear 6.0 cm proximal to its
insertion with 1.7 cm tendon retraction. Thickening of the Achilles
tendon consistent with chronic tendinopathy.

2.  Tendinopathy of the peroneus brevis without discrete tear.

3.  Ankle ligaments are maintained.

4.  No evidence of fracture or dislocation.

5. Marked subcutaneous soft tissue edema about the posterior aspect
of the leg, likely secondary to full-thickness Achilles tear as well
as edema about the ankle.

## 2022-07-29 ENCOUNTER — Ambulatory Visit (INDEPENDENT_AMBULATORY_CARE_PROVIDER_SITE_OTHER): Payer: BC Managed Care – PPO | Admitting: Podiatry

## 2022-07-29 ENCOUNTER — Ambulatory Visit (INDEPENDENT_AMBULATORY_CARE_PROVIDER_SITE_OTHER): Payer: BC Managed Care – PPO

## 2022-07-29 VITALS — BP 141/85 | HR 103

## 2022-07-29 DIAGNOSIS — Q667 Congenital pes cavus, unspecified foot: Secondary | ICD-10-CM

## 2022-07-29 DIAGNOSIS — Q6672 Congenital pes cavus, left foot: Secondary | ICD-10-CM

## 2022-07-29 DIAGNOSIS — M722 Plantar fascial fibromatosis: Secondary | ICD-10-CM

## 2022-07-29 DIAGNOSIS — M7662 Achilles tendinitis, left leg: Secondary | ICD-10-CM

## 2022-07-29 DIAGNOSIS — Q6671 Congenital pes cavus, right foot: Secondary | ICD-10-CM

## 2022-07-29 NOTE — Progress Notes (Signed)
Subjective:  Patient ID: Evelyn Rangel, female    DOB: 05-07-1991,  MRN: [id number]  Chief Complaint  Patient presents with   tedon     Achilles tendon tear, started October 25 2021, had surgery, patient is still having pain, rate of pain 4 out of 10, patient is looking for a second opinion. X-Ray done today,     32 y.o. female presents with the above complaint.  Patient presents with complaint of left Achilles tendinitis.  Patient states that she had a tendon tear back in October 25, 2021 had multiple surgeries to fix it.  Patient states that the rupture part has healed great.  However she is having insertional pain.  She wanted to get another opinion to make sure everything is okay.  She denies any other acute complaints she.  She would like to discuss treatment options she has not been wearing any ankle brace   Review of Systems: Negative except as noted in the HPI. Denies N/V/F/Ch.  Past Medical History:  Diagnosis Date   Allergy    seasonal   Anxiety    Asthma    years since attack, allergy based    Current Outpatient Medications:    albuterol (VENTOLIN HFA) 108 (90 Base) MCG/ACT inhaler, Inhale 2 puffs into the lungs every 6 (six) hours as needed for wheezing or shortness of breath., Disp: 18 g, Rfl: 1   amitriptyline (ELAVIL) 25 MG tablet, Take 50 mg by mouth at bedtime., Disp: , Rfl:    clomiPHENE (CLOMID) 50 MG tablet, Take 100 mg by mouth daily. Daily on days 3-7 of cycle, Disp: , Rfl:    ESTRACE 2 MG tablet, Take 2 mg by mouth 3 (three) times daily. (Patient not taking: Reported on 06/06/2022), Disp: , Rfl:    Multiple Vitamins-Minerals (MULTIVITAMIN WITH MINERALS) tablet, Take 1 tablet by mouth daily., Disp: , Rfl:    OVIDREL 250 MCG/0.5ML injection, SMARTSIG:1 Syringe(s) SUB-Q, Disp: , Rfl:    sertraline (ZOLOFT) 25 MG tablet, Take 100 mg by mouth at bedtime. Dr. Madelaine Bhat, Disp: , Rfl:   Social History   Tobacco Use  Smoking Status Former   Packs/day: 0.50   Years:  7.00   Total pack years: 3.50   Types: Cigarettes   Quit date: 06/19/2021   Years since quitting: 1.1  Smokeless Tobacco Never    Allergies  Allergen Reactions   Erythromycin Hives   Objective:   Vitals:   07/29/22 0941  BP: (!) 141/85  Pulse: (!) 103   There is no height or weight on file to calculate BMI. Constitutional Well developed. Well nourished.  Vascular Dorsalis pedis pulses palpable bilaterally. Posterior tibial pulses palpable bilaterally. Capillary refill normal to all digits.  No cyanosis or clubbing noted. Pedal hair growth normal.  Neurologic Normal speech. Oriented to person, place, and time. Epicritic sensation to light touch grossly present bilaterally.  Dermatologic Nails well groomed and normal in appearance. No open wounds. No skin lesions.  Orthopedic: Pain on palpation left Achilles tendon insertion.  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  Previous surgical incision noted.  Pain on palpation to the insertion of the Achilles.  Pain with dorsal foot and of the ankle no pain with plantarflexion of the ankle   Radiographs: 3 views of skeletally mature adult left ankle: Posterior heel spurring noted.  Pes cavus foot type noted.  No bony abnormalities noted no fractures noted. Assessment:   1. Achilles tendinitis, left leg  2. 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  Left Achilles tendinitis insertional pain with a history of acute rupture -All questions and concerns were discussed with the patient in extensive detail -At this time I discussed conservative care for now.  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  Pes cavus -Given the nature of pes cavus deformity in setting of Achilles tendinitis insertional pain she will benefit from  orthotics to help control the hindfoot motion support the arches of her foot take the stress away from the heel.  She will also benefit from a heel lift -Patient was casted for orthotics with her left  No follow-ups on file.

## 2022-08-04 DIAGNOSIS — F411 Generalized anxiety disorder: Secondary | ICD-10-CM | POA: Diagnosis not present

## 2022-08-07 DIAGNOSIS — N979 Female infertility, unspecified: Secondary | ICD-10-CM | POA: Diagnosis not present

## 2022-08-07 DIAGNOSIS — Z3181 Encounter for male factor infertility in female patient: Secondary | ICD-10-CM | POA: Diagnosis not present

## 2022-08-07 DIAGNOSIS — E288 Other ovarian dysfunction: Secondary | ICD-10-CM | POA: Diagnosis not present

## 2022-08-08 DIAGNOSIS — Z3181 Encounter for male factor infertility in female patient: Secondary | ICD-10-CM | POA: Diagnosis not present

## 2022-08-08 DIAGNOSIS — E288 Other ovarian dysfunction: Secondary | ICD-10-CM | POA: Diagnosis not present

## 2022-08-08 DIAGNOSIS — N979 Female infertility, unspecified: Secondary | ICD-10-CM | POA: Diagnosis not present

## 2022-08-12 DIAGNOSIS — Z9889 Other specified postprocedural states: Secondary | ICD-10-CM | POA: Diagnosis not present

## 2022-08-12 DIAGNOSIS — M25672 Stiffness of left ankle, not elsewhere classified: Secondary | ICD-10-CM | POA: Diagnosis not present

## 2022-08-12 DIAGNOSIS — M6281 Muscle weakness (generalized): Secondary | ICD-10-CM | POA: Diagnosis not present

## 2022-08-19 ENCOUNTER — Other Ambulatory Visit: Payer: Self-pay | Admitting: Podiatry

## 2022-08-19 DIAGNOSIS — M7662 Achilles tendinitis, left leg: Secondary | ICD-10-CM

## 2022-08-19 DIAGNOSIS — M722 Plantar fascial fibromatosis: Secondary | ICD-10-CM

## 2022-08-19 DIAGNOSIS — Q667 Congenital pes cavus, unspecified foot: Secondary | ICD-10-CM

## 2022-08-27 DIAGNOSIS — F411 Generalized anxiety disorder: Secondary | ICD-10-CM | POA: Diagnosis not present

## 2022-09-01 DIAGNOSIS — Z6833 Body mass index (BMI) 33.0-33.9, adult: Secondary | ICD-10-CM | POA: Diagnosis not present

## 2022-09-01 DIAGNOSIS — M5442 Lumbago with sciatica, left side: Secondary | ICD-10-CM | POA: Diagnosis not present

## 2022-09-01 DIAGNOSIS — G8929 Other chronic pain: Secondary | ICD-10-CM | POA: Diagnosis not present

## 2022-09-01 DIAGNOSIS — M5441 Lumbago with sciatica, right side: Secondary | ICD-10-CM | POA: Diagnosis not present

## 2022-09-02 DIAGNOSIS — F411 Generalized anxiety disorder: Secondary | ICD-10-CM | POA: Diagnosis not present

## 2022-09-03 DIAGNOSIS — E288 Other ovarian dysfunction: Secondary | ICD-10-CM | POA: Diagnosis not present

## 2022-09-03 DIAGNOSIS — Z3181 Encounter for male factor infertility in female patient: Secondary | ICD-10-CM | POA: Diagnosis not present

## 2022-09-03 DIAGNOSIS — N979 Female infertility, unspecified: Secondary | ICD-10-CM | POA: Diagnosis not present

## 2022-09-10 DIAGNOSIS — F411 Generalized anxiety disorder: Secondary | ICD-10-CM | POA: Diagnosis not present

## 2022-09-11 DIAGNOSIS — E288 Other ovarian dysfunction: Secondary | ICD-10-CM | POA: Diagnosis not present

## 2022-09-11 DIAGNOSIS — Z3181 Encounter for male factor infertility in female patient: Secondary | ICD-10-CM | POA: Diagnosis not present

## 2022-09-11 DIAGNOSIS — N979 Female infertility, unspecified: Secondary | ICD-10-CM | POA: Diagnosis not present

## 2022-09-12 ENCOUNTER — Encounter: Payer: Self-pay | Admitting: Podiatry

## 2022-09-12 ENCOUNTER — Ambulatory Visit (INDEPENDENT_AMBULATORY_CARE_PROVIDER_SITE_OTHER): Payer: BC Managed Care – PPO | Admitting: Podiatry

## 2022-09-12 VITALS — BP 142/83 | HR 105

## 2022-09-12 DIAGNOSIS — M7662 Achilles tendinitis, left leg: Secondary | ICD-10-CM

## 2022-09-12 DIAGNOSIS — Q667 Congenital pes cavus, unspecified foot: Secondary | ICD-10-CM | POA: Diagnosis not present

## 2022-09-12 NOTE — Progress Notes (Signed)
Subjective:  Patient ID: Evelyn Rangel, female    DOB: [birth year],  MRN: [id number]  Chief Complaint  Patient presents with   Foot Pain    "It's still hurting badly.  I went to an [place] and he seems to think it's my back, Sciatica."    32 y.o. female presents with the above complaint.  Patient presents with follow-up of left Achilles tendinitis is still hurts.  She has a history of multiple tendon surgeries that was done.  She states is still about the same.  She went to an orthopedist who thinks it might be the sciatica that is causing some of her nerve related pain.  She is going to some physical therapy.  She is also here to pick up her orthotics   Review of Systems: Negative except as noted in the HPI. Denies N/V/F/Ch.  Past Medical History:  Diagnosis Date   Allergy    seasonal   Anxiety    Asthma    years since attack, allergy based    Current Outpatient Medications:    albuterol (VENTOLIN HFA) 108 (90 Base) MCG/ACT inhaler, Inhale 2 puffs into the lungs every 6 (six) hours as needed for wheezing or shortness of breath., Disp: 18 g, Rfl: 1   amitriptyline (ELAVIL) 25 MG tablet, Take 50 mg by mouth at bedtime., Disp: , Rfl:    OVIDREL 250 MCG/0.5ML injection, SMARTSIG:1 Syringe(s) SUB-Q, Disp: , Rfl:    sertraline (ZOLOFT) 25 MG tablet, Take 100 mg by mouth at bedtime. Dr. Madelaine Bhat, Disp: , Rfl:    clomiPHENE (CLOMID) 50 MG tablet, Take 100 mg by mouth daily. Daily on days 3-7 of cycle (Patient not taking: Reported on 09/12/2022), Disp: , Rfl:    ESTRACE 2 MG tablet, Take 2 mg by mouth 3 (three) times daily. (Patient not taking: Reported on 06/06/2022), Disp: , Rfl:    Multiple Vitamins-Minerals (MULTIVITAMIN WITH MINERALS) tablet, Take 1 tablet by mouth daily. (Patient not taking: Reported on 09/12/2022), Disp: , Rfl:   Social History   Tobacco Use  Smoking Status Former   Packs/day: 0.50   Years: 7.00   Total pack years: 3.50   Types: Cigarettes   Quit date:  06/19/2021   Years since quitting: 1.2  Smokeless Tobacco Never    Allergies  Allergen Reactions   Erythromycin Hives   Objective:   Vitals:   09/12/22 1021  BP: (!) 142/83  Pulse: (!) 105   There is no height or weight on file to calculate BMI. Constitutional Well developed. Well nourished.  Vascular Dorsalis pedis pulses palpable bilaterally. Posterior tibial pulses palpable bilaterally. Capillary refill normal to all digits.  No cyanosis or clubbing noted. Pedal hair growth normal.  Neurologic Normal speech. Oriented to person, place, and time. Epicritic sensation to light touch grossly present bilaterally.  Dermatologic Nails well groomed and normal in appearance. No open wounds. No skin lesions.  Orthopedic: Pain on palpation left Achilles tendon insertion.  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  Previous surgical incision noted.  Pain on palpation to the insertion of the Achilles.  Pain with dorsal foot and of the ankle no pain with plantarflexion of the ankle   Radiographs: 3 views of skeletally mature adult left ankle: Posterior heel spurring noted.  Pes cavus foot type noted.  No bony abnormalities noted no fractures noted. Assessment:   No diagnosis found.  Plan:  Patient was evaluated and treated and all questions answered.  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  Pes cavus -Given the nature of pes cavus deformity in setting of Achilles tendinitis insertional pain she will benefit from orthotics to help control the hindfoot motion support the arches of her foot take the stress away from the  heel.  She will also benefit from a heel lift -Orthotics were dispensed and they are functioning well.  No acute complaints.  No follow-ups on file.

## 2022-09-15 DIAGNOSIS — E288 Other ovarian dysfunction: Secondary | ICD-10-CM | POA: Diagnosis not present

## 2022-09-15 DIAGNOSIS — Z3181 Encounter for male factor infertility in female patient: Secondary | ICD-10-CM | POA: Diagnosis not present

## 2022-09-15 DIAGNOSIS — N979 Female infertility, unspecified: Secondary | ICD-10-CM | POA: Diagnosis not present

## 2022-09-17 DIAGNOSIS — M5442 Lumbago with sciatica, left side: Secondary | ICD-10-CM | POA: Diagnosis not present

## 2022-09-17 DIAGNOSIS — M5441 Lumbago with sciatica, right side: Secondary | ICD-10-CM | POA: Diagnosis not present

## 2022-09-17 DIAGNOSIS — M6281 Muscle weakness (generalized): Secondary | ICD-10-CM | POA: Diagnosis not present

## 2022-09-24 DIAGNOSIS — M5441 Lumbago with sciatica, right side: Secondary | ICD-10-CM | POA: Diagnosis not present

## 2022-09-24 DIAGNOSIS — M6281 Muscle weakness (generalized): Secondary | ICD-10-CM | POA: Diagnosis not present

## 2022-09-24 DIAGNOSIS — M5442 Lumbago with sciatica, left side: Secondary | ICD-10-CM | POA: Diagnosis not present

## 2022-09-24 DIAGNOSIS — F4310 Post-traumatic stress disorder, unspecified: Secondary | ICD-10-CM | POA: Diagnosis not present

## 2022-09-24 DIAGNOSIS — F411 Generalized anxiety disorder: Secondary | ICD-10-CM | POA: Diagnosis not present

## 2022-09-30 DIAGNOSIS — M5441 Lumbago with sciatica, right side: Secondary | ICD-10-CM | POA: Diagnosis not present

## 2022-09-30 DIAGNOSIS — M5442 Lumbago with sciatica, left side: Secondary | ICD-10-CM | POA: Diagnosis not present

## 2022-09-30 DIAGNOSIS — M6281 Muscle weakness (generalized): Secondary | ICD-10-CM | POA: Diagnosis not present

## 2022-10-01 DIAGNOSIS — F411 Generalized anxiety disorder: Secondary | ICD-10-CM | POA: Diagnosis not present

## 2022-10-07 DIAGNOSIS — N83292 Other ovarian cyst, left side: Secondary | ICD-10-CM | POA: Diagnosis not present

## 2022-10-07 DIAGNOSIS — E288 Other ovarian dysfunction: Secondary | ICD-10-CM | POA: Diagnosis not present

## 2022-10-09 DIAGNOSIS — F411 Generalized anxiety disorder: Secondary | ICD-10-CM | POA: Diagnosis not present

## 2022-10-16 DIAGNOSIS — E288 Other ovarian dysfunction: Secondary | ICD-10-CM | POA: Diagnosis not present

## 2022-10-16 DIAGNOSIS — Z3181 Encounter for male factor infertility in female patient: Secondary | ICD-10-CM | POA: Diagnosis not present

## 2022-10-16 DIAGNOSIS — N979 Female infertility, unspecified: Secondary | ICD-10-CM | POA: Diagnosis not present

## 2022-10-16 DIAGNOSIS — F411 Generalized anxiety disorder: Secondary | ICD-10-CM | POA: Diagnosis not present

## 2022-10-21 DIAGNOSIS — E288 Other ovarian dysfunction: Secondary | ICD-10-CM | POA: Diagnosis not present

## 2022-10-21 DIAGNOSIS — Z3181 Encounter for male factor infertility in female patient: Secondary | ICD-10-CM | POA: Diagnosis not present

## 2022-10-22 DIAGNOSIS — F411 Generalized anxiety disorder: Secondary | ICD-10-CM | POA: Diagnosis not present

## 2022-10-28 DIAGNOSIS — M5442 Lumbago with sciatica, left side: Secondary | ICD-10-CM | POA: Diagnosis not present

## 2022-10-28 DIAGNOSIS — M5441 Lumbago with sciatica, right side: Secondary | ICD-10-CM | POA: Diagnosis not present

## 2022-10-28 DIAGNOSIS — M6281 Muscle weakness (generalized): Secondary | ICD-10-CM | POA: Diagnosis not present

## 2022-10-29 DIAGNOSIS — F411 Generalized anxiety disorder: Secondary | ICD-10-CM | POA: Diagnosis not present

## 2022-11-04 DIAGNOSIS — M5441 Lumbago with sciatica, right side: Secondary | ICD-10-CM | POA: Diagnosis not present

## 2022-11-04 DIAGNOSIS — M6281 Muscle weakness (generalized): Secondary | ICD-10-CM | POA: Diagnosis not present

## 2022-11-04 DIAGNOSIS — M5442 Lumbago with sciatica, left side: Secondary | ICD-10-CM | POA: Diagnosis not present

## 2022-11-12 DIAGNOSIS — F411 Generalized anxiety disorder: Secondary | ICD-10-CM | POA: Diagnosis not present

## 2022-11-21 DIAGNOSIS — Z3491 Encounter for supervision of normal pregnancy, unspecified, first trimester: Secondary | ICD-10-CM | POA: Diagnosis not present

## 2022-11-21 DIAGNOSIS — Z3A01 Less than 8 weeks gestation of pregnancy: Secondary | ICD-10-CM | POA: Diagnosis not present

## 2022-11-25 DIAGNOSIS — F411 Generalized anxiety disorder: Secondary | ICD-10-CM | POA: Diagnosis not present

## 2022-12-03 DIAGNOSIS — Z3491 Encounter for supervision of normal pregnancy, unspecified, first trimester: Secondary | ICD-10-CM | POA: Diagnosis not present

## 2022-12-03 DIAGNOSIS — Z3A08 8 weeks gestation of pregnancy: Secondary | ICD-10-CM | POA: Diagnosis not present

## 2022-12-15 DIAGNOSIS — O0993 Supervision of high risk pregnancy, unspecified, third trimester: Secondary | ICD-10-CM | POA: Insufficient documentation

## 2022-12-16 DIAGNOSIS — Z8742 Personal history of other diseases of the female genital tract: Secondary | ICD-10-CM | POA: Diagnosis not present

## 2022-12-16 DIAGNOSIS — R87610 Atypical squamous cells of undetermined significance on cytologic smear of cervix (ASC-US): Secondary | ICD-10-CM | POA: Diagnosis not present

## 2022-12-16 DIAGNOSIS — O0901 Supervision of pregnancy with history of infertility, first trimester: Secondary | ICD-10-CM | POA: Diagnosis not present

## 2022-12-17 DIAGNOSIS — Z114 Encounter for screening for human immunodeficiency virus [HIV]: Secondary | ICD-10-CM | POA: Diagnosis not present

## 2022-12-17 DIAGNOSIS — O0901 Supervision of pregnancy with history of infertility, first trimester: Secondary | ICD-10-CM | POA: Diagnosis not present

## 2022-12-17 DIAGNOSIS — O99210 Obesity complicating pregnancy, unspecified trimester: Secondary | ICD-10-CM | POA: Diagnosis not present

## 2022-12-17 DIAGNOSIS — F411 Generalized anxiety disorder: Secondary | ICD-10-CM | POA: Diagnosis not present

## 2022-12-17 LAB — OB RESULTS CONSOLE HEPATITIS B SURFACE ANTIGEN: Hepatitis B Surface Ag: NEGATIVE

## 2022-12-17 LAB — OB RESULTS CONSOLE HIV ANTIBODY (ROUTINE TESTING): HIV: NONREACTIVE

## 2022-12-17 LAB — OB RESULTS CONSOLE RUBELLA ANTIBODY, IGM: Rubella: IMMUNE

## 2022-12-17 LAB — OB RESULTS CONSOLE VARICELLA ZOSTER ANTIBODY, IGG: Varicella: IMMUNE

## 2022-12-17 LAB — OB RESULTS CONSOLE RPR: RPR: NONREACTIVE

## 2022-12-25 DIAGNOSIS — F411 Generalized anxiety disorder: Secondary | ICD-10-CM | POA: Diagnosis not present

## 2022-12-29 DIAGNOSIS — R7309 Other abnormal glucose: Secondary | ICD-10-CM | POA: Diagnosis not present

## 2023-01-05 ENCOUNTER — Encounter: Payer: Self-pay | Admitting: Internal Medicine

## 2023-01-05 ENCOUNTER — Ambulatory Visit: Payer: Self-pay | Admitting: *Deleted

## 2023-01-05 ENCOUNTER — Ambulatory Visit (INDEPENDENT_AMBULATORY_CARE_PROVIDER_SITE_OTHER): Payer: BC Managed Care – PPO | Admitting: Internal Medicine

## 2023-01-05 VITALS — BP 128/72 | HR 72 | Ht 65.0 in | Wt 206.4 lb

## 2023-01-05 DIAGNOSIS — H6690 Otitis media, unspecified, unspecified ear: Secondary | ICD-10-CM | POA: Diagnosis not present

## 2023-01-05 MED ORDER — AMOXICILLIN 875 MG PO TABS: 875.0000 mg | ORAL_TABLET | Freq: Two times a day (BID) | ORAL | 0 refills | Status: AC

## 2023-01-05 NOTE — Progress Notes (Signed)
Date:  01/05/2023   Name:  Evelyn Rangel   DOB:  1991/04/20   MRN:  [id number]   Chief Complaint: Sore Throat (Left throat tenderness on the outside of skin radiating to left ear. Patient is [redacted] weeks pregnant. )  Otalgia  There is pain in the left ear. This is a new problem. The problem occurs constantly. The problem has been unchanged. There has been no fever. The fever has been present for 3 to 4 days. Associated symptoms include neck pain and rhinorrhea. Pertinent negatives include no sore throat.    Lab Results  Component Value Date   NA 137 12/01/2019   K 4.0 12/01/2019   CO2 26 12/01/2019   GLUCOSE 89 12/01/2019   BUN 12 12/01/2019   CREATININE 0.82 12/01/2019   CALCIUM 9.4 12/01/2019   GFRNONAA >60 12/01/2019   Lab Results  Component Value Date   CHOL 216 (H) 10/13/2019   HDL 44 10/13/2019   LDLCALC 149 (H) 10/13/2019   TRIG 125 10/13/2019   CHOLHDL 4.9 (H) 10/13/2019   Lab Results  Component Value Date   TSH 2.80 06/09/2019   Lab Results  Component Value Date   HGBA1C 5.5 10/13/2019   Lab Results  Component Value Date   WBC 9.7 12/01/2019   HGB 14.0 12/01/2019   HCT 41.7 12/01/2019   MCV 84.4 12/01/2019   PLT 432 (H) 12/01/2019   Lab Results  Component Value Date   ALT 32 12/01/2019   AST 26 12/01/2019   ALKPHOS 76 12/01/2019   BILITOT 0.5 12/01/2019   Lab Results  Component Value Date   VD25OH 32.7 10/13/2019     Review of Systems  Constitutional:  Negative for chills, fatigue and fever.  HENT:  Positive for ear pain and rhinorrhea. Negative for sore throat and trouble swallowing.   Respiratory:  Negative for shortness of breath.   Cardiovascular:  Negative for chest pain.  Musculoskeletal:  Positive for neck pain.    Patient Active Problem List   Diagnosis Date Noted   Current moderate 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 prior episode (HCC) 10/13/2019   Mild intermittent asthma without complication 06/14/2019   Prediabetes  06/14/2019   Vitamin D deficiency 06/14/2019   Vitamin B12 deficiency 06/14/2019   Tobacco use disorder 06/14/2019   Impingement syndrome of left shoulder 06/14/2019    Allergies  Allergen Reactions   Erythromycin Hives    Past Surgical History:  Procedure Laterality Date   ACHILLES TENDON SURGERY Left 11/07/2021   Procedure: ACHILLES TENDON REPAIR;  Surgeon: Rosetta Posner, DPM;  Location: [facility];  Service: Podiatry;  Laterality: Left;   INCISION AND DRAINAGE Left 05/15/2022   Procedure: INCISION AND DRAINAGE;  Surgeon: Rosetta Posner, DPM;  Location: [facility];  Service: Podiatry;  Laterality: Left;   lymph node removal     TONSILECTOMY/ADENOIDECTOMY WITH MYRINGOTOMY  2012   TONSILLECTOMY     TYMPANOSTOMY TUBE PLACEMENT     WISDOM TOOTH EXTRACTION  2009    Social History   Tobacco Use   Smoking status: Former    Packs/day: 0.50    Years: 7.00    Additional pack years: 0.00    Total pack years: 3.50    Types: Cigarettes    Quit date: 06/19/2021    Years since quitting: 1.5   Smokeless tobacco: Never  Vaping Use   Vaping Use: Some days  Substance Use Topics   Alcohol use: Yes    Alcohol/week: 1.0  standard drink of alcohol    Types: 1 Standard drinks or equivalent per week   Drug use: Yes    Types: Marijuana     Medication list has been reviewed and updated.  Current Meds  Medication Sig   albuterol (VENTOLIN HFA) 108 (90 Base) MCG/ACT inhaler Inhale 2 puffs into the lungs every 6 (six) hours as needed for wheezing or shortness of breath.   amoxicillin (AMOXIL) 875 MG tablet Take 1 tablet (875 mg total) by mouth 2 (two) times daily for 7 days.   aspirin EC 81 MG tablet Take 81 mg by mouth daily. Swallow whole.   Doxylamine-Pyridoxine 10-10 MG TBEC Take 2 tablets by mouth daily.   Prenatal Vit-Fe Fumarate-FA (PRENATAL MULTIVITAMIN) TABS tablet Take 1 tablet by mouth daily at 12 noon.   sertraline (ZOLOFT) 25 MG tablet Take 100 mg by mouth at  bedtime. Dr. Billy Coast   [DISCONTINUED] atomoxetine (STRATTERA) 80 MG capsule Take 80 mg by mouth every morning.   [DISCONTINUED] letrozole (FEMARA) 2.5 MG tablet Take 2.5 mg by mouth daily.       06/06/2022    1:56 PM 05/10/2021    3:48 PM 08/07/2020   11:23 AM 07/09/2020   11:07 AM  GAD 7 : Generalized Anxiety Score  Nervous, Anxious, on Edge 0 1 0 0  Control/stop worrying 0 1 0 0  Worry too much - different things 0 0 0 0  Trouble relaxing 0 1 0 0  Restless 0 0 0 0  Easily annoyed or irritable 0 0 0 0  Afraid - awful might happen 0 0 0 0  Total GAD 7 Score 0 3 0 0  Anxiety Difficulty Not difficult at all Not difficult at all Not difficult at all        01/05/2023    4:04 PM 06/06/2022    1:56 PM 05/10/2021    3:47 PM  Depression screen PHQ 2/9  Decreased Interest 0 0 0  Down, Depressed, Hopeless 0 0 0  PHQ - 2 Score 0 0 0  Altered sleeping 0 0 0  Tired, decreased energy 0 0 0  Change in appetite 0 0 0  Feeling bad or failure about yourself  0 0 0  Trouble concentrating 0 0 0  Moving slowly or fidgety/restless 0 0 0  Suicidal thoughts 0 0 0  PHQ-9 Score 0 0 0  Difficult doing work/chores Not difficult at all Not difficult at all Not difficult at all    BP Readings from Last 3 Encounters:  01/05/23 128/72  09/12/22 (!) 142/83  07/29/22 (!) 141/85    Physical Exam Constitutional:      Appearance: She is well-developed.  HENT:     Head: Normocephalic.     Right Ear: Tympanic membrane normal.     Left Ear: Tenderness present. No drainage or swelling. Tympanic membrane is retracted (and dull).     Nose:     Right Sinus: No maxillary sinus tenderness or frontal sinus tenderness.     Left Sinus: No maxillary sinus tenderness or frontal sinus tenderness.     Mouth/Throat:     Pharynx: Oropharynx is clear.  Cardiovascular:     Rate and Rhythm: Normal rate and regular rhythm.  Pulmonary:     Effort: Pulmonary effort is normal.     Breath sounds: Normal  breath sounds.  Neurological:     Mental Status: She is alert.     Wt Readings from Last 3 Encounters:  01/05/23  206 lb 6.4 oz (93.6 kg)  06/06/22 212 lb (96.2 kg)  05/15/22 208 lb (94.3 kg)    BP 128/72   Pulse 72   Ht 5\' 5"  (1.651 m)   Wt 206 lb 6.4 oz (93.6 kg)   LMP 10/05/2022 (Exact Date)   SpO2 97%   BMI 34.35 kg/m   Assessment and Plan:  Problem List Items Addressed This Visit   None Visit Diagnoses     Acute otitis media, unspecified otitis media type    -  Primary   treat with Amox bid x 7 days Use flonase nasal spray, tylenol for pain and warm compresses   Relevant Medications   amoxicillin (AMOXIL) 875 MG tablet       No follow-ups on file.   Partially dictated using Dragon software, any errors are not intentional.  Reubin Milan, MD [facility] [place], [place]

## 2023-01-05 NOTE — Telephone Encounter (Signed)
  Chief Complaint: Muscular pain in left side of neck shooting up into her left ear.  She is [redacted] weeks pregnant and having a lot of forceful vomiting since week 5-6 of her pregnancy.   Symptoms: Having congestion and mild cough told this was normal during the first part of pregnancy. Frequency: Vomiting daily.   Pertinent Negatives: Patient denies fever Disposition: [] ED /[] Urgent Care (no appt availability in office) / [x] Appointment(In office/virtual)/ []  [facility]/ [] Home Care/ [] Refused Recommended Disposition /[] [facility]/ []  Follow-up with PCP Additional Notes: Appt made for today with Dr. Judithann Graves at 4:00.

## 2023-01-05 NOTE — Patient Instructions (Signed)
Take Tylenol 500 mg tid and use warm compresses  Also nasal spray like Flonase daily

## 2023-01-05 NOTE — Telephone Encounter (Signed)
Reason for Disposition  [1] MODERATE pain (e.g., interferes with normal activities) AND [2] constant AND [3] present > 24 hours  Answer Assessment - Initial Assessment Questions 1. ONSET: "When did the pain start?" (e.g., minutes, hours, days)     I have left jaw pain radiating up to my left ear.   I'm pregnant [redacted] weeks.   Been vomiting.   Morning sickness.   I'm vomiting since week 5 and 6.    My glucose levels are not normal now.   I wasn't able to complete the glucose test.    I'm coughing also.    I'm pricking my finger 4 times a day.  I have congestion and coughing.   Told me it's normal for my sinuses to be congested during the first trimester. The pain is where you put your fingers to check your pulse on my throat.    My throat and ear hurt on left side.   It's not a sore throat but a muscle pain.   I felt a muscle in my neck and it shot up to my left ear.   My left ear is congested and popping a lot.   I figured it from congestion.    I did pull a stomach muscle from all the vomiting.     I'm wondering if this from vomiting so much.    My OB GYN is aware of the vomiting.   I saw her last at 10 weeks.   I'm to see her next week.   2. ONSET: "Does the pain come and go, or has it been constant since it started?" (e.g., constant, intermittent, fleeting)     The throat pain started over the weekend.   I can't sleep on my left side.   When I turn my head fast it hurts.   3. SEVERITY: "How bad is the pain?"   (Scale 1-10; mild, moderate or severe)   - MILD (1-3): doesn't interfere with normal activities    - MODERATE (4-7): interferes with normal activities or awakens from sleep    - SEVERE (8-10): excruciating pain, unable to do any normal activities      Moderate 4. LOCATION: "Where does it hurt?"      Left side of my neck shooting up into my left ear.   I've been having a lot of forceful vomiting since week 5-6 of my pregnancy. 5. RASH: "Is there any redness, rash, or swelling of the  face?"     No 6. FEVER: "Do you have a fever?" If Yes, ask: "What is it, how was it measured, and when did it start?"      No 7. OTHER SYMPTOMS: "Do you have any other symptoms?" (e.g., fever, toothache, nasal discharge, nasal congestion, clicking sensation in jaw joint)     No sore throat inside.  It's a muscular sore on the outside of my left neck going up into my left ear.   Vomiting a lot. 8. PREGNANCY: "Is there any chance you are pregnant?" "When was your last menstrual period?"     Yes  13 weeks.  Protocols used: Face Pain-A-AH

## 2023-01-08 DIAGNOSIS — F411 Generalized anxiety disorder: Secondary | ICD-10-CM | POA: Diagnosis not present

## 2023-01-12 DIAGNOSIS — F411 Generalized anxiety disorder: Secondary | ICD-10-CM | POA: Diagnosis not present

## 2023-01-13 DIAGNOSIS — O24414 Gestational diabetes mellitus in pregnancy, insulin controlled: Secondary | ICD-10-CM

## 2023-01-19 DIAGNOSIS — M5489 Other dorsalgia: Secondary | ICD-10-CM | POA: Diagnosis not present

## 2023-02-04 ENCOUNTER — Ambulatory Visit: Payer: BC Managed Care – PPO

## 2023-02-10 DIAGNOSIS — O0992 Supervision of high risk pregnancy, unspecified, second trimester: Secondary | ICD-10-CM | POA: Diagnosis not present

## 2023-02-18 ENCOUNTER — Encounter: Payer: Self-pay | Admitting: Dietician

## 2023-02-18 ENCOUNTER — Encounter: Payer: BC Managed Care – PPO | Admitting: Dietician

## 2023-02-18 DIAGNOSIS — Z3A00 Weeks of gestation of pregnancy not specified: Secondary | ICD-10-CM | POA: Insufficient documentation

## 2023-02-18 DIAGNOSIS — Z713 Dietary counseling and surveillance: Secondary | ICD-10-CM | POA: Insufficient documentation

## 2023-02-18 DIAGNOSIS — O24419 Gestational diabetes mellitus in pregnancy, unspecified control: Secondary | ICD-10-CM

## 2023-02-18 DIAGNOSIS — O24410 Gestational diabetes mellitus in pregnancy, diet controlled: Secondary | ICD-10-CM | POA: Insufficient documentation

## 2023-02-18 NOTE — Progress Notes (Signed)
Patient was seen on 02/18/2023 for Gestational Diabetes self-management class at the [facility]. The following learning objectives were met by the patient during this course:  States the definition of Gestational Diabetes States why dietary management is important in controlling blood glucose Describes the effects each nutrient has on blood glucose levels Demonstrates ability to create a balanced meal plan Demonstrates carbohydrate counting  States when to check blood glucose levels Demonstrates proper blood glucose monitoring techniques States the effect of stress and exercise on blood glucose levels States the importance of limiting caffeine and abstaining from alcohol and smoking   Patient instructed to monitor glucose levels: FBS: 60 - <90 1 hour: <140 2 hour: <120  *Patient received handouts: Nutrition Diabetes and Pregnancy Carbohydrate Counting List  Patient will be seen for follow-up as needed.

## 2023-02-24 DIAGNOSIS — F411 Generalized anxiety disorder: Secondary | ICD-10-CM | POA: Diagnosis not present

## 2023-03-02 DIAGNOSIS — O0902 Supervision of pregnancy with history of infertility, second trimester: Secondary | ICD-10-CM | POA: Diagnosis not present

## 2023-03-10 DIAGNOSIS — F411 Generalized anxiety disorder: Secondary | ICD-10-CM | POA: Diagnosis not present

## 2023-03-16 DIAGNOSIS — Z362 Encounter for other antenatal screening follow-up: Secondary | ICD-10-CM | POA: Diagnosis not present

## 2023-03-24 DIAGNOSIS — F411 Generalized anxiety disorder: Secondary | ICD-10-CM | POA: Diagnosis not present

## 2023-04-15 DIAGNOSIS — Z23 Encounter for immunization: Secondary | ICD-10-CM | POA: Diagnosis not present

## 2023-04-15 DIAGNOSIS — O0992 Supervision of high risk pregnancy, unspecified, second trimester: Secondary | ICD-10-CM | POA: Diagnosis not present

## 2023-04-16 DIAGNOSIS — F411 Generalized anxiety disorder: Secondary | ICD-10-CM | POA: Diagnosis not present

## 2023-04-20 ENCOUNTER — Ambulatory Visit
Admission: EM | Admit: 2023-04-20 | Discharge: 2023-04-20 | Disposition: A | Discharge status: Home / Self Care | Payer: BC Managed Care – PPO | Attending: Emergency Medicine | Admitting: Emergency Medicine

## 2023-04-20 DIAGNOSIS — E860 Dehydration: Secondary | ICD-10-CM | POA: Diagnosis not present

## 2023-04-20 DIAGNOSIS — R112 Nausea with vomiting, unspecified: Secondary | ICD-10-CM | POA: Diagnosis not present

## 2023-04-20 DIAGNOSIS — N390 Urinary tract infection, site not specified: Secondary | ICD-10-CM | POA: Diagnosis not present

## 2023-04-20 LAB — COMPREHENSIVE METABOLIC PANEL
ALT: 15 U/L (ref 0–44)
AST: 14 U/L — ABNORMAL LOW (ref 15–41)
Albumin: 3.4 g/dL — ABNORMAL LOW (ref 3.5–5.0)
Alkaline Phosphatase: 89 U/L (ref 38–126)
Anion gap: 11 (ref 5–15)
BUN: 7 mg/dL (ref 6–20)
CO2: 21 mmol/L — ABNORMAL LOW (ref 22–32)
Calcium: 9 mg/dL (ref 8.9–10.3)
Chloride: 103 mmol/L (ref 98–111)
Creatinine, Ser: 0.55 mg/dL (ref 0.44–1.00)
GFR, Estimated: >60 mL/min (ref >60)
Glucose, Bld: 102 mg/dL — ABNORMAL HIGH (ref 70–99)
Potassium: 3.6 mmol/L (ref 3.5–5.1)
Sodium: 135 mmol/L (ref 135–145)
Total Bilirubin: 0.4 mg/dL (ref 0.3–1.2)
Total Protein: 7.5 g/dL (ref 6.5–8.1)

## 2023-04-20 LAB — CBC WITH DIFFERENTIAL/PLATELET
Abs Immature Granulocytes: 0.03 10*3/uL (ref 0.00–0.07)
Basophils Absolute: 0 10*3/uL (ref 0.0–0.1)
Basophils Relative: 0 %
Eosinophils Absolute: 0.1 10*3/uL (ref 0.0–0.5)
Eosinophils Relative: 0 %
HCT: 32.5 % — ABNORMAL LOW (ref 36.0–46.0)
Hemoglobin: 10.9 g/dL — ABNORMAL LOW (ref 12.0–15.0)
Immature Granulocytes: 0 %
Lymphocytes Relative: 27 %
Lymphs Abs: 3.1 10*3/uL (ref 0.7–4.0)
MCH: 27.5 pg (ref 26.0–34.0)
MCHC: 33.5 g/dL (ref 30.0–36.0)
MCV: 82.1 fL (ref 80.0–100.0)
Monocytes Absolute: 0.9 10*3/uL (ref 0.1–1.0)
Monocytes Relative: 7 %
Neutro Abs: 7.7 10*3/uL (ref 1.7–7.7)
Neutrophils Relative %: 66 %
Platelets: 387 10*3/uL (ref 150–400)
RBC: 3.96 MIL/uL (ref 3.87–5.11)
RDW: 14.5 % (ref 11.5–15.5)
WBC: 11.7 10*3/uL — ABNORMAL HIGH (ref 4.0–10.5)
nRBC: 0 % (ref 0.0–0.2)

## 2023-04-20 LAB — URINALYSIS, ROUTINE W REFLEX MICROSCOPIC
Glucose, UA: NEGATIVE mg/dL
Hgb urine dipstick: NEGATIVE
Ketones, ur: 40 mg/dL — AB
Leukocytes,Ua: NEGATIVE
Nitrite: NEGATIVE
Specific Gravity, Urine: 1.025 (ref 1.005–1.030)
pH: 6 (ref 5.0–8.0)

## 2023-04-20 LAB — URINALYSIS, MICROSCOPIC (REFLEX)

## 2023-04-20 LAB — LIPASE, BLOOD: Lipase: 24 U/L (ref 11–51)

## 2023-04-20 MED ORDER — SODIUM CHLORIDE 0.9 % IV SOLN: Freq: Once | INTRAVENOUS | Status: DC

## 2023-04-20 MED ORDER — ONDANSETRON HCL 4 MG/2ML IJ SOLN
4.0000 mg | Freq: Once | INTRAMUSCULAR | Status: AC
  Administered 2023-04-20: 4 mg via INTRAVENOUS

## 2023-04-20 MED ORDER — PROMETHAZINE HCL 25 MG RE SUPP: 25.0000 mg | Freq: Four times a day (QID) | RECTAL | 0 refills | Status: DC | PRN

## 2023-04-20 MED ORDER — CEPHALEXIN 500 MG PO CAPS: 500.0000 mg | ORAL_CAPSULE | Freq: Two times a day (BID) | ORAL | 0 refills | Status: DC

## 2023-04-20 NOTE — Discharge Instructions (Addendum)
Push electrolyte containing fluids such as Pedialyte, liquid IV or Gatorade until your urine is clear.  I am concerned that you are symptoms are coming from a urinary tract infection, so I am sending you home on Keflex.  I have sent your urine off for culture to make sure that we have you on the correct antibiotic.  Phenergan suppository for nausea and vomiting if Zofran is no longer working.  Please follow-up with your OB/GYN to make sure that you are getting better in about 3 days.  Go to the ER if you get worse, back pain, fevers above 100.4, or for any other concerns.

## 2023-04-20 NOTE — ED Triage Notes (Signed)
Patient states she have not been able to keep down her Zofran, have not eaten since Saturday.

## 2023-04-20 NOTE — ED Provider Notes (Signed)
HPI  SUBJECTIVE:  Evelyn Rangel is a do 69P1 32 y.o. female who presents with over 8 episodes of nonbilious, nonbloody emesis over the past 24 hours, anorexia for the past 3 days.  She states that she has not eaten during this time, but has been sipping water.  She reports nausea, but has had this throughout her entire pregnancy.  It has not changed in the past 3 days.  No fever, change in urine output, urinary complaints, abdominal pain, contractions, leakage of fluid, passage of tissue, vaginal bleeding.  She states that she is feeling baby move.  She has no respiratory complaints or cough, diarrhea.  Her last bowel movement was 3 days ago.  She had a prenatal visit about 5 or 6 days ago, where they changed her insulin to NPH 18 units/day.  She also had a Tdap and flu vaccine.  However, she was fine for 2-3 before her symptoms started.  She has not checked her glucose over the past few days.  No recent travels, raw undercooked foods, questionable leftovers, contacts with a gastroenteritis.  She denies lightheadedness, dizziness.  She has tried Zofran ODT, bland food and ginger ale.  No alleviating factors.  Symptoms are worse with any p.o. intake.  She was sent in by her OB for evaluation for dehydration and IV fluids. She has a past medical history of gestational diabetes on insulin.  She is currently 28 2/[redacted] weeks pregnant.   Past Medical History:  Diagnosis Date   Allergy    seasonal   Anxiety    Asthma    years since attack, allergy based    Past Surgical History:  Procedure Laterality Date   ACHILLES TENDON SURGERY Left 11/07/2021   Procedure: ACHILLES TENDON REPAIR;  Surgeon: Rosetta Posner, DPM;  Location: [facility];  Service: Podiatry;  Laterality: Left;   INCISION AND DRAINAGE Left 05/15/2022   Procedure: INCISION AND DRAINAGE;  Surgeon: Rosetta Posner, DPM;  Location: [facility];  Service: Podiatry;  Laterality: Left;   lymph node removal     TONSILECTOMY/ADENOIDECTOMY  WITH MYRINGOTOMY  2012   TONSILLECTOMY     TYMPANOSTOMY TUBE PLACEMENT     WISDOM TOOTH EXTRACTION  2009    Family History  Problem Relation Age of Onset   Depression Mother    Mental illness Mother    Thyroid disease Mother    Depression Father    Mental illness Father     Social History   Tobacco Use   Smoking status: Former    Current packs/day: 0.00    Average packs/day: 0.5 packs/day for 7.0 years (3.5 ttl pk-yrs)    Types: Cigarettes    Start date: 06/19/2014    Quit date: 06/19/2021    Years since quitting: 1.8   Smokeless tobacco: Never  Vaping Use   Vaping status: Some Days  Substance Use Topics   Alcohol use: Not Currently    Alcohol/week: 1.0 standard drink of alcohol    Types: 1 Standard drinks or equivalent per week   Drug use: Not Currently    Types: Marijuana     Current Facility-Administered Medications:    0.9 %  sodium chloride infusion, , Intravenous, Once, Domenick Gong, MD  Current Outpatient Medications:    cephALEXin (KEFLEX) 500 MG capsule, Take 1 capsule (500 mg total) by mouth 2 (two) times daily., Disp: 14 capsule, Rfl: 0   promethazine (PHENERGAN) 25 MG suppository, Place 1 suppository (25 mg total) rectally every 6 (six) hours as  needed for nausea or vomiting., Disp: 20 each, Rfl: 0   albuterol (VENTOLIN HFA) 108 (90 Base) MCG/ACT inhaler, Inhale 2 puffs into the lungs every 6 (six) hours as needed for wheezing or shortness of breath., Disp: 18 g, Rfl: 1   aspirin EC 81 MG tablet, Take 81 mg by mouth daily. Swallow whole., Disp: , Rfl:    Doxylamine-Pyridoxine 10-10 MG TBEC, Take 2 tablets by mouth daily., Disp: , Rfl:    Prenatal Vit-Fe Fumarate-FA (PRENATAL MULTIVITAMIN) TABS tablet, Take 1 tablet by mouth daily at 12 noon., Disp: , Rfl:    sertraline (ZOLOFT) 25 MG tablet, Take 100 mg by mouth at bedtime. Dr. Billy Coast, Disp: , Rfl:   Allergies  Allergen Reactions   Erythromycin Hives     ROS  As noted in HPI.    Physical Exam  BP 113/75 (BP Location: Left Arm)   Pulse 86   Temp 98.5 F (36.9 C) (Oral)   Resp 20   Ht 5\' 5"  (1.651 m)   Wt 91.6 kg   LMP 10/05/2022 (Exact Date)   SpO2 99%   BMI 33.61 kg/m   Constitutional: Well developed, well nourished, no acute distress Eyes:  EOMI, conjunctiva normal bilaterally HENT: Normocephalic, atraumatic,mucus membranes moist Respiratory: Normal inspiratory effort Cardiovascular: Normal rate, regular rhythm GI: nondistended soft.  Nontender.  Gravid uterus.  Fundus height consistent with dates.  No suprapubic, flank tenderness.  Hypoactive bowel sounds.  No guarding, rebound. Back: No CVAT skin: No rash, skin intact Musculoskeletal: no deformities Neurologic: Alert & oriented x 3, no focal neuro deficits Psychiatric: Speech and behavior appropriate   ED Course   Medications  0.9 %  sodium chloride infusion (has no administration in time range)  ondansetron (ZOFRAN) injection 4 mg (4 mg Intravenous Given 04/20/23 1813)    Orders Placed This Encounter  Procedures   Urine Culture    Standing Status:   Standing    Number of Occurrences:   1    Order Specific Question:   Indication    Answer:   Bacteriuria screening (OB/GYN or Uro)   CBC with Differential    Standing Status:   Standing    Number of Occurrences:   1   Comprehensive metabolic panel    Standing Status:   Standing    Number of Occurrences:   1   Lipase, blood    Standing Status:   Standing    Number of Occurrences:   1   Urinalysis, Routine w reflex microscopic -Urine, Clean Catch    Standing Status:   Standing    Number of Occurrences:   1    Order Specific Question:   Specimen Source    Answer:   Urine, Clean Catch [[id number]]   Urinalysis, Microscopic (reflex)    Standing Status:   Standing    Number of Occurrences:   1   Assess fetal heart tones    Standing Status:   Standing    Number of Occurrences:   1   Offer Fluids    Standing Status:   Standing    Number of  Occurrences:   20   Insert peripheral IV    Standing Status:   Standing    Number of Occurrences:   1    Results for orders placed or performed during the hospital encounter of 04/20/23 (from the past 24 hour(s))  CBC with Differential     Status: Abnormal   Collection Time: 04/20/23  5:55 PM  Result Value Ref Range   WBC 11.7 (H) 4.0 - 10.5 K/uL   RBC 3.96 3.87 - 5.11 MIL/uL   Hemoglobin 10.9 (L) 12.0 - 15.0 g/dL   HCT 98.1 (L) 19.1 - 47.8 %   MCV 82.1 80.0 - 100.0 fL   MCH 27.5 26.0 - 34.0 pg   MCHC 33.5 30.0 - 36.0 g/dL   RDW 29.5 62.1 - 30.8 %   Platelets 387 150 - 400 K/uL   nRBC 0.0 0.0 - 0.2 %   Neutrophils Relative % 66 %   Neutro Abs 7.7 1.7 - 7.7 K/uL   Lymphocytes Relative 27 %   Lymphs Abs 3.1 0.7 - 4.0 K/uL   Monocytes Relative 7 %   Monocytes Absolute 0.9 0.1 - 1.0 K/uL   Eosinophils Relative 0 %   Eosinophils Absolute 0.1 0.0 - 0.5 K/uL   Basophils Relative 0 %   Basophils Absolute 0.0 0.0 - 0.1 K/uL   Immature Granulocytes 0 %   Abs Immature Granulocytes 0.03 0.00 - 0.07 K/uL  Comprehensive metabolic panel     Status: Abnormal   Collection Time: 04/20/23  5:55 PM  Result Value Ref Range   Sodium 135 135 - 145 mmol/L   Potassium 3.6 3.5 - 5.1 mmol/L   Chloride 103 98 - 111 mmol/L   CO2 21 (L) 22 - 32 mmol/L   Glucose, Bld 102 (H) 70 - 99 mg/dL   BUN 7 6 - 20 mg/dL   Creatinine, Ser 6.57 0.44 - 1.00 mg/dL   Calcium 9.0 8.9 - 84.6 mg/dL   Total Protein 7.5 6.5 - 8.1 g/dL   Albumin 3.4 (L) 3.5 - 5.0 g/dL   AST 14 (L) 15 - 41 U/L   ALT 15 0 - 44 U/L   Alkaline Phosphatase 89 38 - 126 U/L   Total Bilirubin 0.4 0.3 - 1.2 mg/dL   GFR, Estimated >96 >29 mL/min   Anion gap 11 5 - 15  Lipase, blood     Status: None   Collection Time: 04/20/23  5:55 PM  Result Value Ref Range   Lipase 24 11 - 51 U/L  Urinalysis, Routine w reflex microscopic -Urine, Clean Catch     Status: Abnormal   Collection Time: 04/20/23  5:55 PM  Result Value Ref Range   Color, Urine  YELLOW YELLOW   APPearance HAZY (A) CLEAR   Specific Gravity, Urine 1.025 1.005 - 1.030   pH 6.0 5.0 - 8.0   Glucose, UA NEGATIVE NEGATIVE mg/dL   Hgb urine dipstick NEGATIVE NEGATIVE   Bilirubin Urine SMALL (A) NEGATIVE   Ketones, ur 40 (A) NEGATIVE mg/dL   Protein, ur TRACE (A) NEGATIVE mg/dL   Nitrite NEGATIVE NEGATIVE   Leukocytes,Ua NEGATIVE NEGATIVE  Urinalysis, Microscopic (reflex)     Status: Abnormal   Collection Time: 04/20/23  5:55 PM  Result Value Ref Range   RBC / HPF 6-10 0 - 5 RBC/hpf   WBC, UA 0-5 0 - 5 WBC/hpf   Bacteria, UA MANY (A) NONE SEEN   Squamous Epithelial / HPF 0-5 0 - 5 /HPF   Non Squamous Epithelial PRESENT (A) NONE SEEN   Mucus PRESENT    Amorphous Crystal PRESENT    No results found.  ED Clinical Impression  1. Nausea and vomiting, unspecified vomiting type   2. Dehydration   3. Urinary tract infection without hematuria, site unspecified      ED Assessment/Plan     Outside records reviewed.  As  noted in HPI.  Patient sent here by OB/GYN for IV fluids and evaluation/treatment of dehydration.  She appears nontoxic, her vitals are normal here.  However, she has gestational diabetes.  Will check UA, CBC, lipase, CMP, fetal heart tones, and give Zofran 4 mg IV, 1 L of fluids.  Will also give her p.o. challenge with electrolyte containing fluids.  UA significant for many bacteria, she did not have any on her most recent UA.  She also has some ketones and trace proteinuria.  She has a very mild leukocytosis with no shift.  Glucose within normal limits, liver enzymes not elevated.  Will send urine off for culture.  She is not in DKA.   On reevaluation, she has drank half a liter of electrolyte containing fluids and has had 500 cc of normal saline.  She states she feels significantly better, is hungry, and is ready to go home.   FHT 145 Home with Keflex for the bacteriuria, UTI, Phenergan for the nausea and vomiting since Zofran is no longer working,  push electrolyte containing fluids.  Discussed labs, MDM, treatment plan, and plan for follow-up with patient. Discussed sn/sx that should prompt return to the ED. patient agrees with plan.   Meds ordered this encounter  Medications   ondansetron (ZOFRAN) injection 4 mg   0.9 %  sodium chloride infusion   promethazine (PHENERGAN) 25 MG suppository    Sig: Place 1 suppository (25 mg total) rectally every 6 (six) hours as needed for nausea or vomiting.    Dispense:  20 each    Refill:  0   cephALEXin (KEFLEX) 500 MG capsule    Sig: Take 1 capsule (500 mg total) by mouth 2 (two) times daily.    Dispense:  14 capsule    Refill:  0      *This clinic note was created using Scientist, clinical (histocompatibility and immunogenetics). Therefore, there may be occasional mistakes despite careful proofreading.  ?    Domenick Gong, MD 04/21/23 [phone]

## 2023-04-22 LAB — URINE CULTURE

## 2023-04-23 DIAGNOSIS — F411 Generalized anxiety disorder: Secondary | ICD-10-CM | POA: Diagnosis not present

## 2023-04-28 DIAGNOSIS — F411 Generalized anxiety disorder: Secondary | ICD-10-CM | POA: Diagnosis not present

## 2023-05-06 DIAGNOSIS — F411 Generalized anxiety disorder: Secondary | ICD-10-CM | POA: Diagnosis not present

## 2023-05-11 DIAGNOSIS — F411 Generalized anxiety disorder: Secondary | ICD-10-CM | POA: Diagnosis not present

## 2023-05-13 DIAGNOSIS — F419 Anxiety disorder, unspecified: Secondary | ICD-10-CM | POA: Insufficient documentation

## 2023-05-13 DIAGNOSIS — O0993 Supervision of high risk pregnancy, unspecified, third trimester: Secondary | ICD-10-CM | POA: Diagnosis not present

## 2023-05-13 DIAGNOSIS — O24414 Gestational diabetes mellitus in pregnancy, insulin controlled: Secondary | ICD-10-CM | POA: Diagnosis not present

## 2023-05-13 DIAGNOSIS — Z8742 Personal history of other diseases of the female genital tract: Secondary | ICD-10-CM | POA: Insufficient documentation

## 2023-05-18 DIAGNOSIS — F411 Generalized anxiety disorder: Secondary | ICD-10-CM | POA: Diagnosis not present

## 2023-05-19 DIAGNOSIS — O24414 Gestational diabetes mellitus in pregnancy, insulin controlled: Secondary | ICD-10-CM | POA: Diagnosis not present

## 2023-05-19 DIAGNOSIS — Z2911 Encounter for prophylactic immunotherapy for respiratory syncytial virus (RSV): Secondary | ICD-10-CM | POA: Diagnosis not present

## 2023-05-19 DIAGNOSIS — Z3A32 32 weeks gestation of pregnancy: Secondary | ICD-10-CM | POA: Diagnosis not present

## 2023-05-19 DIAGNOSIS — O0993 Supervision of high risk pregnancy, unspecified, third trimester: Secondary | ICD-10-CM | POA: Diagnosis not present

## 2023-05-22 DIAGNOSIS — O99210 Obesity complicating pregnancy, unspecified trimester: Secondary | ICD-10-CM | POA: Diagnosis not present

## 2023-05-22 DIAGNOSIS — O24414 Gestational diabetes mellitus in pregnancy, insulin controlled: Secondary | ICD-10-CM | POA: Diagnosis not present

## 2023-05-22 DIAGNOSIS — O0993 Supervision of high risk pregnancy, unspecified, third trimester: Secondary | ICD-10-CM | POA: Diagnosis not present

## 2023-05-25 DIAGNOSIS — F411 Generalized anxiety disorder: Secondary | ICD-10-CM | POA: Diagnosis not present

## 2023-05-26 DIAGNOSIS — O0993 Supervision of high risk pregnancy, unspecified, third trimester: Secondary | ICD-10-CM | POA: Diagnosis not present

## 2023-05-26 DIAGNOSIS — O24414 Gestational diabetes mellitus in pregnancy, insulin controlled: Secondary | ICD-10-CM | POA: Diagnosis not present

## 2023-05-29 DIAGNOSIS — O24414 Gestational diabetes mellitus in pregnancy, insulin controlled: Secondary | ICD-10-CM | POA: Diagnosis not present

## 2023-05-29 DIAGNOSIS — O0993 Supervision of high risk pregnancy, unspecified, third trimester: Secondary | ICD-10-CM | POA: Diagnosis not present

## 2023-06-01 DIAGNOSIS — O24414 Gestational diabetes mellitus in pregnancy, insulin controlled: Secondary | ICD-10-CM | POA: Diagnosis not present

## 2023-06-03 DIAGNOSIS — F411 Generalized anxiety disorder: Secondary | ICD-10-CM | POA: Diagnosis not present

## 2023-06-05 DIAGNOSIS — O0993 Supervision of high risk pregnancy, unspecified, third trimester: Secondary | ICD-10-CM | POA: Diagnosis not present

## 2023-06-05 DIAGNOSIS — O24414 Gestational diabetes mellitus in pregnancy, insulin controlled: Secondary | ICD-10-CM | POA: Diagnosis not present

## 2023-06-08 DIAGNOSIS — O99210 Obesity complicating pregnancy, unspecified trimester: Secondary | ICD-10-CM | POA: Diagnosis not present

## 2023-06-08 DIAGNOSIS — O24414 Gestational diabetes mellitus in pregnancy, insulin controlled: Secondary | ICD-10-CM | POA: Diagnosis not present

## 2023-06-08 DIAGNOSIS — O0993 Supervision of high risk pregnancy, unspecified, third trimester: Secondary | ICD-10-CM | POA: Diagnosis not present

## 2023-06-16 DIAGNOSIS — O0993 Supervision of high risk pregnancy, unspecified, third trimester: Secondary | ICD-10-CM | POA: Diagnosis not present

## 2023-06-16 DIAGNOSIS — O24414 Gestational diabetes mellitus in pregnancy, insulin controlled: Secondary | ICD-10-CM | POA: Diagnosis not present

## 2023-06-16 LAB — OB RESULTS CONSOLE GC/CHLAMYDIA
Chlamydia: NEGATIVE
Neisseria Gonorrhea: NEGATIVE

## 2023-06-18 ENCOUNTER — Inpatient Hospital Stay
Admission: EM | Admit: 2023-06-18 | Discharge: 2023-06-21 | DRG: 787 | Disposition: A | Discharge status: Home / Self Care | Payer: BC Managed Care – PPO | Attending: Obstetrics | Admitting: Obstetrics

## 2023-06-18 ENCOUNTER — Other Ambulatory Visit: Payer: Self-pay

## 2023-06-18 ENCOUNTER — Inpatient Hospital Stay: Payer: BC Managed Care – PPO | Admitting: General Practice

## 2023-06-18 ENCOUNTER — Encounter
Admission: EM | Disposition: A | Discharge status: Home / Self Care | Payer: Self-pay | Source: Home / Self Care | Attending: Obstetrics

## 2023-06-18 ENCOUNTER — Encounter: Payer: Self-pay | Admitting: Obstetrics and Gynecology

## 2023-06-18 DIAGNOSIS — Z87891 Personal history of nicotine dependence: Secondary | ICD-10-CM

## 2023-06-18 DIAGNOSIS — Z818 Family history of other mental and behavioral disorders: Secondary | ICD-10-CM | POA: Diagnosis not present

## 2023-06-18 DIAGNOSIS — O24414 Gestational diabetes mellitus in pregnancy, insulin controlled: Secondary | ICD-10-CM

## 2023-06-18 DIAGNOSIS — O42913 Preterm premature rupture of membranes, unspecified as to length of time between rupture and onset of labor, third trimester: Principal | ICD-10-CM | POA: Diagnosis present

## 2023-06-18 DIAGNOSIS — D62 Acute posthemorrhagic anemia: Secondary | ICD-10-CM | POA: Diagnosis not present

## 2023-06-18 DIAGNOSIS — O26893 Other specified pregnancy related conditions, third trimester: Secondary | ICD-10-CM | POA: Diagnosis present

## 2023-06-18 DIAGNOSIS — Z881 Allergy status to other antibiotic agents status: Secondary | ICD-10-CM

## 2023-06-18 DIAGNOSIS — O24424 Gestational diabetes mellitus in childbirth, insulin controlled: Secondary | ICD-10-CM | POA: Diagnosis present

## 2023-06-18 DIAGNOSIS — O99214 Obesity complicating childbirth: Secondary | ICD-10-CM | POA: Diagnosis present

## 2023-06-18 DIAGNOSIS — Z3A36 36 weeks gestation of pregnancy: Secondary | ICD-10-CM | POA: Diagnosis not present

## 2023-06-18 DIAGNOSIS — O9081 Anemia of the puerperium: Secondary | ICD-10-CM | POA: Diagnosis not present

## 2023-06-18 DIAGNOSIS — Z7982 Long term (current) use of aspirin: Secondary | ICD-10-CM

## 2023-06-18 DIAGNOSIS — R030 Elevated blood-pressure reading, without diagnosis of hypertension: Secondary | ICD-10-CM | POA: Diagnosis present

## 2023-06-18 DIAGNOSIS — O99210 Obesity complicating pregnancy, unspecified trimester: Secondary | ICD-10-CM | POA: Diagnosis present

## 2023-06-18 DIAGNOSIS — O24434 Gestational diabetes mellitus in the puerperium, insulin controlled: Secondary | ICD-10-CM | POA: Diagnosis not present

## 2023-06-18 LAB — COMPREHENSIVE METABOLIC PANEL
ALT: 17 U/L (ref 0–44)
AST: 18 U/L (ref 15–41)
Albumin: 3 g/dL — ABNORMAL LOW (ref 3.5–5.0)
Alkaline Phosphatase: 143 U/L — ABNORMAL HIGH (ref 38–126)
Anion gap: 12 (ref 5–15)
BUN: 7 mg/dL (ref 6–20)
CO2: 19 mmol/L — ABNORMAL LOW (ref 22–32)
Calcium: 8.7 mg/dL — ABNORMAL LOW (ref 8.9–10.3)
Chloride: 104 mmol/L (ref 98–111)
Creatinine, Ser: 0.7 mg/dL (ref 0.44–1.00)
GFR, Estimated: >60 mL/min (ref >60)
Glucose, Bld: 87 mg/dL (ref 70–99)
Potassium: 3.6 mmol/L (ref 3.5–5.1)
Sodium: 135 mmol/L (ref 135–145)
Total Bilirubin: <0.2 mg/dL (ref <1.2)
Total Protein: 6.7 g/dL (ref 6.5–8.1)

## 2023-06-18 LAB — ABO/RH: ABO/RH(D): O POS

## 2023-06-18 LAB — GLUCOSE, CAPILLARY
Glucose-Capillary: 79 mg/dL (ref 70–99)
Glucose-Capillary: 77 mg/dL (ref 70–99)
Glucose-Capillary: 75 mg/dL (ref 70–99)
Glucose-Capillary: 92 mg/dL (ref 70–99)
Glucose-Capillary: 78 mg/dL (ref 70–99)

## 2023-06-18 LAB — TYPE AND SCREEN
ABO/RH(D): O POS
Antibody Screen: NEGATIVE

## 2023-06-18 LAB — CBC
HCT: 32.1 % — ABNORMAL LOW (ref 36.0–46.0)
Hemoglobin: 10.9 g/dL — ABNORMAL LOW (ref 12.0–15.0)
MCH: 27 pg (ref 26.0–34.0)
MCHC: 34 g/dL (ref 30.0–36.0)
MCV: 79.7 fL — ABNORMAL LOW (ref 80.0–100.0)
Platelets: 394 10*3/uL (ref 150–400)
RBC: 4.03 MIL/uL (ref 3.87–5.11)
RDW: 13.5 % (ref 11.5–15.5)
WBC: 10.9 10*3/uL — ABNORMAL HIGH (ref 4.0–10.5)
nRBC: 0 % (ref 0.0–0.2)

## 2023-06-18 LAB — PROTEIN / CREATININE RATIO, URINE
Creatinine, Urine: 262 mg/dL
Protein Creatinine Ratio: 0.14 mg/mg{creat} (ref 0.00–0.15)
Total Protein, Urine: 37 mg/dL

## 2023-06-18 LAB — GROUP B STREP BY PCR: Group B strep by PCR: NEGATIVE

## 2023-06-18 LAB — RUPTURE OF MEMBRANE (ROM)PLUS: Rom Plus: POSITIVE

## 2023-06-18 LAB — RPR: RPR Ser Ql: NONREACTIVE

## 2023-06-18 SURGERY — Surgical Case
Anesthesia: Epidural

## 2023-06-18 MED ORDER — LIDOCAINE HCL (PF) 2 % IJ SOLN
INTRAMUSCULAR | Status: DC | PRN
  Administered 2023-06-18: 100 mg via EPIDURAL
  Administered 2023-06-18: 60 mg via EPIDURAL
  Administered 2023-06-18: 40 mg via EPIDURAL
  Administered 2023-06-18: 100 mg via EPIDURAL

## 2023-06-18 MED ORDER — ONDANSETRON HCL 4 MG/2ML IJ SOLN
INTRAMUSCULAR | Status: DC | PRN
  Administered 2023-06-18: 4 mg via INTRAVENOUS

## 2023-06-18 MED ORDER — CEFAZOLIN SODIUM-DEXTROSE 2-4 GM/100ML-% IV SOLN
INTRAVENOUS | Status: AC
  Filled 2023-06-18: qty 100

## 2023-06-18 MED ORDER — MISOPROSTOL 200 MCG PO TABS
ORAL_TABLET | ORAL | Status: AC
  Filled 2023-06-18: qty 4

## 2023-06-18 MED ORDER — SODIUM CHLORIDE 0.9 % IV SOLN
INTRAVENOUS | Status: DC | PRN
  Administered 2023-06-18 (×2): 5 mL via EPIDURAL

## 2023-06-18 MED ORDER — AMMONIA AROMATIC IN INHA
RESPIRATORY_TRACT | Status: AC
  Filled 2023-06-18: qty 10

## 2023-06-18 MED ORDER — FENTANYL-BUPIVACAINE-NACL 0.5-0.125-0.9 MG/250ML-% EP SOLN
12.0000 mL/h | EPIDURAL | Status: DC | PRN
  Administered 2023-06-18: 12 mL/h via EPIDURAL
  Filled 2023-06-18: qty 250

## 2023-06-18 MED ORDER — SODIUM CHLORIDE 0.9 % IV SOLN
INTRAVENOUS | Status: AC
  Filled 2023-06-18: qty 5

## 2023-06-18 MED ORDER — ONDANSETRON HCL 4 MG/2ML IJ SOLN
INTRAMUSCULAR | Status: AC
  Filled 2023-06-18: qty 2

## 2023-06-18 MED ORDER — LIDOCAINE HCL (PF) 1 % IJ SOLN
INTRAMUSCULAR | Status: AC
  Filled 2023-06-18: qty 30

## 2023-06-18 MED ORDER — LACTATED RINGERS IV SOLN
500.0000 mL | INTRAVENOUS | Status: DC | PRN
  Administered 2023-06-18: 1000 mL via INTRAVENOUS
  Administered 2023-06-18: 250 mL via INTRAVENOUS

## 2023-06-18 MED ORDER — OXYTOCIN BOLUS FROM INFUSION: 333.0000 mL | Freq: Once | INTRAVENOUS | Status: DC

## 2023-06-18 MED ORDER — LACTATED RINGERS IV SOLN: 500.0000 mL | Freq: Once | INTRAVENOUS | Status: DC

## 2023-06-18 MED ORDER — BUPIVACAINE HCL (PF) 0.5 % IJ SOLN
INTRAMUSCULAR | Status: AC
  Filled 2023-06-18: qty 30

## 2023-06-18 MED ORDER — FENTANYL CITRATE (PF) 100 MCG/2ML IJ SOLN
INTRAMUSCULAR | Status: AC
  Filled 2023-06-18: qty 2

## 2023-06-18 MED ORDER — ONDANSETRON HCL 4 MG/2ML IJ SOLN: 4.0000 mg | Freq: Four times a day (QID) | INTRAMUSCULAR | Status: DC | PRN

## 2023-06-18 MED ORDER — TERBUTALINE SULFATE 1 MG/ML IJ SOLN: 0.2500 mg | Freq: Once | INTRAMUSCULAR | Status: DC | PRN

## 2023-06-18 MED ORDER — PHENYLEPHRINE 80 MCG/ML (10ML) SYRINGE FOR IV PUSH (FOR BLOOD PRESSURE SUPPORT): 80.0000 ug | PREFILLED_SYRINGE | INTRAVENOUS | Status: DC | PRN

## 2023-06-18 MED ORDER — EPHEDRINE 5 MG/ML INJ: 10.0000 mg | INTRAVENOUS | Status: DC | PRN

## 2023-06-18 MED ORDER — SOD CITRATE-CITRIC ACID 500-334 MG/5ML PO SOLN
ORAL | Status: AC
  Administered 2023-06-18: 30 mL
  Filled 2023-06-18: qty 15

## 2023-06-18 MED ORDER — LIDOCAINE-EPINEPHRINE (PF) 1.5 %-1:200000 IJ SOLN
INTRAMUSCULAR | Status: DC | PRN
  Administered 2023-06-18: 3 mL via PERINEURAL

## 2023-06-18 MED ORDER — OXYTOCIN 10 UNIT/ML IJ SOLN
INTRAMUSCULAR | Status: AC
  Filled 2023-06-18: qty 2

## 2023-06-18 MED ORDER — LIDOCAINE HCL (PF) 1 % IJ SOLN: 30.0000 mL | INTRAMUSCULAR | Status: DC | PRN

## 2023-06-18 MED ORDER — EPHEDRINE 5 MG/ML INJ
10.0000 mg | INTRAVENOUS | Status: DC | PRN
  Filled 2023-06-18: qty 5

## 2023-06-18 MED ORDER — SOD CITRATE-CITRIC ACID 500-334 MG/5ML PO SOLN: 30.0000 mL | ORAL | Status: DC | PRN

## 2023-06-18 MED ORDER — ACETAMINOPHEN 500 MG PO TABS: 1000.0000 mg | ORAL_TABLET | Freq: Four times a day (QID) | ORAL | Status: DC | PRN

## 2023-06-18 MED ORDER — SODIUM CHLORIDE 0.9% FLUSH: 3.0000 mL | Freq: Two times a day (BID) | INTRAVENOUS | Status: DC

## 2023-06-18 MED ORDER — SODIUM CHLORIDE 0.9 % IV SOLN: 250.0000 mL | INTRAVENOUS | Status: DC | PRN

## 2023-06-18 MED ORDER — FENTANYL CITRATE (PF) 100 MCG/2ML IJ SOLN
INTRAMUSCULAR | Status: DC | PRN
  Administered 2023-06-18: 100 ug via EPIDURAL

## 2023-06-18 MED ORDER — CEFAZOLIN SODIUM-DEXTROSE 2-3 GM-%(50ML) IV SOLR
INTRAVENOUS | Status: DC | PRN
  Administered 2023-06-18: 2 g via INTRAVENOUS

## 2023-06-18 MED ORDER — OXYTOCIN-SODIUM CHLORIDE 30-0.9 UT/500ML-% IV SOLN
2.5000 [IU]/h | INTRAVENOUS | Status: DC
  Filled 2023-06-18: qty 500

## 2023-06-18 MED ORDER — SODIUM CHLORIDE 0.9% FLUSH: 3.0000 mL | INTRAVENOUS | Status: DC | PRN

## 2023-06-18 MED ORDER — OXYTOCIN-SODIUM CHLORIDE 30-0.9 UT/500ML-% IV SOLN
INTRAVENOUS | Status: AC
  Filled 2023-06-18: qty 500

## 2023-06-18 MED ORDER — DEXAMETHASONE SODIUM PHOSPHATE 10 MG/ML IJ SOLN
INTRAMUSCULAR | Status: DC | PRN
Start: 2023-06-18 — End: 2023-06-18
  Administered 2023-06-18: 10 mg via INTRAVENOUS

## 2023-06-18 MED ORDER — LACTATED RINGERS IV SOLN: INTRAVENOUS | Status: DC

## 2023-06-18 MED ORDER — DEXAMETHASONE SODIUM PHOSPHATE 10 MG/ML IJ SOLN
INTRAMUSCULAR | Status: AC
  Filled 2023-06-18: qty 1

## 2023-06-18 MED ORDER — OXYTOCIN-SODIUM CHLORIDE 30-0.9 UT/500ML-% IV SOLN
1.0000 m[IU]/min | INTRAVENOUS | Status: DC
  Administered 2023-06-18: 8 m[IU]/min via INTRAVENOUS
  Administered 2023-06-18: 2 m[IU]/min via INTRAVENOUS

## 2023-06-18 MED ORDER — OXYTOCIN-SODIUM CHLORIDE 30-0.9 UT/500ML-% IV SOLN
INTRAVENOUS | Status: DC | PRN
  Administered 2023-06-18: 300 mL via INTRAVENOUS

## 2023-06-18 MED ORDER — KETOROLAC TROMETHAMINE 30 MG/ML IJ SOLN
INTRAMUSCULAR | Status: DC | PRN
  Administered 2023-06-18: 30 mg via INTRAVENOUS

## 2023-06-18 MED ORDER — DIPHENHYDRAMINE HCL 50 MG/ML IJ SOLN: 12.5000 mg | INTRAMUSCULAR | Status: DC | PRN

## 2023-06-18 MED ORDER — FENTANYL CITRATE (PF) 100 MCG/2ML IJ SOLN: 50.0000 ug | INTRAMUSCULAR | Status: DC | PRN

## 2023-06-18 MED ORDER — LACTATED RINGERS AMNIOINFUSION
INTRAVENOUS | Status: DC
  Filled 2023-06-18: qty 1000

## 2023-06-18 MED ORDER — MORPHINE SULFATE (PF) 0.5 MG/ML IJ SOLN
INTRAMUSCULAR | Status: AC
  Filled 2023-06-18: qty 10

## 2023-06-18 MED ORDER — MORPHINE SULFATE (PF) 0.5 MG/ML IJ SOLN
INTRAMUSCULAR | Status: DC | PRN
  Administered 2023-06-18: 3 mg via EPIDURAL

## 2023-06-18 SURGICAL SUPPLY — 28 items
BARRIER ADHS 3X4 INTERCEED (GAUZE/BANDAGES/DRESSINGS) IMPLANT
CHLORAPREP W/TINT 26 (MISCELLANEOUS) ×1 IMPLANT
DRSG TELFA 3X8 NADH (GAUZE/BANDAGES/DRESSINGS) ×1 IMPLANT
DRSG TELFA 3X8 NADH STRL (GAUZE/BANDAGES/DRESSINGS) ×1 IMPLANT
ELECT REM PT RETURN 9FT ADLT (ELECTROSURGICAL) ×1
ELECTRODE REM PT RTRN 9FT ADLT (ELECTROSURGICAL) ×1 IMPLANT
GAUZE SPONGE 4X4 12PLY STRL (GAUZE/BANDAGES/DRESSINGS) ×1 IMPLANT
GOWN STRL REUS W/ TWL LRG LVL3 (GOWN DISPOSABLE) ×3 IMPLANT
MANIFOLD NEPTUNE II (INSTRUMENTS) ×1 IMPLANT
MAT PREVALON FULL STRYKER (MISCELLANEOUS) ×1 IMPLANT
NDL HYPO 25GX1X1/2 BEV (NEEDLE) ×1 IMPLANT
NEEDLE HYPO 25GX1X1/2 BEV (NEEDLE) ×1 IMPLANT
NS IRRIG 1000ML POUR BTL (IV SOLUTION) ×1 IMPLANT
PACK C SECTION AR (MISCELLANEOUS) ×1 IMPLANT
PAD DRESSING TELFA 3X8 NADH (GAUZE/BANDAGES/DRESSINGS) IMPLANT
PAD OB MATERNITY 4.3X12.25 (PERSONAL CARE ITEMS) ×1 IMPLANT
PAD PREP OB/GYN DISP 24X41 (PERSONAL CARE ITEMS) ×1 IMPLANT
SCRUB CHG 4% DYNA-HEX 4OZ (MISCELLANEOUS) ×1 IMPLANT
STAPLER INSORB 30 2030 C-SECTI (MISCELLANEOUS) IMPLANT
SUT MNCRL 4-0 27XMFL (SUTURE) ×1
SUT VIC AB 0 CT1 36 (SUTURE) ×2 IMPLANT
SUT VIC AB 0 CTX36XBRD ANBCTRL (SUTURE) ×2 IMPLANT
SUT VIC AB 2-0 SH 27XBRD (SUTURE) ×2 IMPLANT
SUTURE MNCRL 4-0 27XMF (SUTURE) ×1 IMPLANT
SYR 30ML LL (SYRINGE) ×2 IMPLANT
TAPE MEDIFIX FOAM 3 (GAUZE/BANDAGES/DRESSINGS) IMPLANT
TRAP FLUID SMOKE EVACUATOR (MISCELLANEOUS) ×1 IMPLANT
WATER STERILE IRR 500ML POUR (IV SOLUTION) ×1 IMPLANT

## 2023-06-18 NOTE — Anesthesia Preprocedure Evaluation (Signed)
Anesthesia Evaluation  Patient identified by MRN, date of birth, ID band Patient awake    Reviewed: Allergy & Precautions, NPO status , Patient's Chart, lab work & pertinent test results  Airway Mallampati: III  TM Distance: >3 FB Neck ROM: full    Dental  (+) Chipped   Pulmonary neg pulmonary ROS, asthma , former smoker   Pulmonary exam normal        Cardiovascular Exercise Tolerance: Good negative cardio ROS Normal cardiovascular exam     Neuro/Psych  PSYCHIATRIC DISORDERS Anxiety        GI/Hepatic negative GI ROS,,,  Endo/Other    Renal/GU   negative genitourinary   Musculoskeletal   Abdominal   Peds  Hematology negative hematology ROS (+)   Anesthesia Other Findings Past Medical History: No date: Allergy     Comment:  seasonal No date: Anxiety No date: Asthma     Comment:  years since attack, allergy based  Past Surgical History: 11/07/2021: ACHILLES TENDON SURGERY; Left     Comment:  Procedure: ACHILLES TENDON REPAIR;  Surgeon: Rosetta Posner, DPM;  Location: [facility];  Service:               Podiatry;  Laterality: Left; 05/15/2022: INCISION AND DRAINAGE; Left     Comment:  Procedure: INCISION AND DRAINAGE;  Surgeon: Rosetta Posner, DPM;  Location: [facility];  Service:               Podiatry;  Laterality: Left; No date: lymph node removal 2012: TONSILECTOMY/ADENOIDECTOMY WITH MYRINGOTOMY No date: TONSILLECTOMY No date: TYMPANOSTOMY TUBE PLACEMENT 2009: WISDOM TOOTH EXTRACTION  BMI    Body Mass Index: 33.12 kg/m      Reproductive/Obstetrics (+) Pregnancy                             Anesthesia Physical Anesthesia Plan  ASA: 2  Anesthesia Plan: Epidural   Post-op Pain Management:    Induction:   PONV Risk Score and Plan: 2  Airway Management Planned: Natural Airway  Additional Equipment:   Intra-op Plan:    Post-operative Plan:   Informed Consent: I have reviewed the patients History and Physical, chart, labs and discussed the procedure including the risks, benefits and alternatives for the proposed anesthesia with the patient or authorized representative who has indicated his/her understanding and acceptance.     Dental Advisory Given  Plan Discussed with: Anesthesiologist  Anesthesia Plan Comments: (Patient reports no bleeding problems and no anticoagulant use.   Patient consented for risks of anesthesia including but not limited to:  - adverse reactions to medications - risk of bleeding, infection and or nerve damage from epidural that could lead to paralysis - risk of headache or failed epidural - nerve damage due to positioning - that if epidural is used for C-section that there is a chance of epidural failure requiring spinal placement or conversion to GA - Damage to heart, brain, lungs, other parts of body or loss of life  Patient voiced understanding and assent.)       Anesthesia Quick Evaluation

## 2023-06-18 NOTE — Progress Notes (Addendum)
Labor Progress Note  Evelyn Rangel is a 32 y.o. G1P0 at [redacted]w[redacted]d by LMP admitted for PROM  Subjective: Assumed care at 0800, got report from RN  Objective: BP 125/89   Pulse 70   Temp 98.2 F (36.8 C) (Oral)   Resp 17   LMP 10/04/2022 (Exact Date)   Fetal Assessment: FHT:  FHR: 135 bpm, variability: moderate,  accelerations:  Present,  decelerations:  Absent Category/reactivity:  Category I UC:   regular, every 4-9 minutes SVE:    Dilation: 1.5 cm  Effacement: 80%  Station:  -2  Consistency: ---  Position: ---  Membrane status:pPROM at 0300 Amniotic color: Clear, pink  Labs: Lab Results  Component Value Date   WBC 10.9 (H) 06/18/2023   HGB 10.9 (L) 06/18/2023   HCT 32.1 (L) 06/18/2023   MCV 79.7 (L) 06/18/2023   PLT 394 06/18/2023    Assessment / Plan: Induction of labor due to PROM,  progressing well on pitocin 0300 pPROM 0510 1.5/80/-2 1046 Pitocin started  Labor: Progressing normally Preeclampsia:   125/89   - PCR 140, CBC and CMP WNL GDM: Insulin dependent  - CBG: 79, 77 Fetal Wellbeing:  Category I Pain Control:  Labor support without medications I/D:   Afebrile, GBS presumptive neg, pPROM x 7 hours  Anticipated MOD:  NSVD  Jenifer E Shaton Lore, CNM 06/18/2023, 10:36 AM

## 2023-06-18 NOTE — Progress Notes (Signed)
Labor Progress Note  Evelyn Rangel is a 32 y.o. G1P0 at [redacted]w[redacted]d by LMP admitted for PROM  Subjective: Pt is comfortable with her epidural and laying on her right side  Objective: BP 123/71   Pulse 83   Temp 98.2 F (36.8 C) (Oral)   Resp 17   Ht 5\' 5"  (1.651 m)   Wt 90.3 kg   LMP 10/04/2022 (Exact Date)   SpO2 99%   BMI 33.12 kg/m   Fetal Assessment: FHT:  FHR: 130 bpm, variability: moderate,  accelerations:  Present,  decelerations:  Absent Category/reactivity:  Category I UC:   regular, every 4 minutes SVE:    Dilation: 6 cm  Effacement: 100%  Station:  -1  Consistency: soft  Position: middle  Membrane status:pPROM at 0300 Amniotic color: Clear, pink  Labs: Lab Results  Component Value Date   WBC 10.9 (H) 06/18/2023   HGB 10.9 (L) 06/18/2023   HCT 32.1 (L) 06/18/2023   MCV 79.7 (L) 06/18/2023   PLT 394 06/18/2023    Assessment / Plan: Induction of labor due to PROM,  progressing well on pitocin 0300 pPROM 0510 1.5/80/-2 1136 Pit started 1600 5/100/0  FSE and IUPC placed, Pitocin stopped 1654 Pitocin restarted 1835 Pitocin stopped for variables 1920 6/100/0 Pitocin now at 4mU and MVU 130-200   Labor: Progressing normally Preeclampsia:   123/71  - PCR 140, CBC and CMP WNL GDM: Insulin dependent  - CBG: 79, 77, 75, 92 Fetal Wellbeing:  Category I Pain Control:  Epidural I/D:   Afebrile, GBS neg, pPROM x 16 hours  Anticipated MOD:  NSVD  Cyril Mourning, CNM 06/18/2023, 7:31 PM

## 2023-06-18 NOTE — Progress Notes (Signed)
Labor Progress Note  Evelyn Rangel is a 32 y.o. G1P0 at [redacted]w[redacted]d by LMP admitted for PROM  Subjective: Called to bedside for decelerations  Objective: BP 123/71   Pulse 83   Temp 97.9 F (36.6 C) (Oral)   Resp 17   Ht 5\' 5"  (1.651 m)   Wt 90.3 kg   LMP 10/04/2022 (Exact Date)   SpO2 99%   BMI 33.12 kg/m   Fetal Assessment: FHT:   Prior to decels baseline was 145 with moderate variability, followed by 4 decels, lowest down to 50s. Position changes made, bolus started, and Pit turned off with resolution.  Category/reactivity:  Category II UC:   regular, every 4-9 minutes SVE:    Dilation: 5 cm  Effacement: 100%  Station:  -1, 0  Consistency: soft  Position: middle  Membrane status:pPROM at 0300 Amniotic color: Clear, pink  Labs: Lab Results  Component Value Date   WBC 10.9 (H) 06/18/2023   HGB 10.9 (L) 06/18/2023   HCT 32.1 (L) 06/18/2023   MCV 79.7 (L) 06/18/2023   PLT 394 06/18/2023    Assessment / Plan: Induction of labor due to PROM,  progressing well on pitocin 0300 pPROM 0510 1.5/80/-2 1136 Pit started 1600 5/100/0  FSE and IUPC placed   Labor: Progressing normally Preeclampsia:   134/76  - PCR 140, CBC and CMP WNL GDM: Insulin dependent  - CBG: 79, 77, 75, 92 Fetal Wellbeing:  Category I and Category II Pain Control:  Epidural I/D:   Afebrile, GBS neg, pPROM x 13 hours  Anticipated MOD:  NSVD  Cyril Mourning, CNM 06/18/2023, 7:24 PM

## 2023-06-18 NOTE — Transfer of Care (Signed)
Immediate Anesthesia Transfer of Care Note  Patient: Evelyn Rangel  Procedure(s) Performed: CESAREAN SECTION  Patient Location:  LDR 4  Anesthesia Type:Epidural  Level of Consciousness: awake, alert , and oriented  Airway & Oxygen Therapy: Patient Spontanous Breathing  Post-op Assessment: Report given to RN and Post -op Vital signs reviewed and stable  Post vital signs: Reviewed and stable  Last Vitals:  Vitals Value Taken Time  BP 100/85 06/18/23 2341  Temp    Pulse    Resp 18 06/18/23 2342  SpO2    Vitals shown include unfiled device data.  Last Pain:  Vitals:   06/18/23 1906  TempSrc: Oral  PainSc:          Complications: No notable events documented.

## 2023-06-18 NOTE — H&P (Signed)
OB History & Physical   History of Present Illness:   Chief Complaint: leakage of fluid   HPI:  Evelyn Rangel is a 32 y.o. G1P0 female at [redacted]w[redacted]d, Patient's last menstrual period was 10/04/2022 (exact date)., consistent with [place] at [redacted]w[redacted]d, with Estimated Date of Delivery: 07/11/23.  She presents to L&D for leaking large amount of clear fluid. She reports an initial gush around 0300 this morning followed by several large gushes and continued clear fluid.  Her pregnancy is complicated by gestational diabetes and obesity .  She denies Contractions or Vaginal bleeding. Endorses fetal movement as active. Denies HA, changes in vision, or RUQ pain.   Reports active fetal movement  Contractions: irregular cramping  LOF/SROM: Clear fluid at 0300 on 06/18/2023 Vaginal bleeding: denies   Factors complicating pregnancy:  Principal Problem:   Preterm premature rupture of membranes in third trimester, unspecified duration to onset of labor Active Problems:   Insulin controlled gestational diabetes mellitus (GDM) during pregnancy, antepartum   Obesity affecting pregnancy    Patient Active Problem List   Diagnosis Date Noted   Preterm premature rupture of membranes in third trimester, unspecified duration to onset of labor 06/18/2023   Obesity affecting pregnancy 06/18/2023   Anxiety 05/13/2023   History of infertility 05/13/2023   Insulin controlled gestational diabetes mellitus (GDM) during pregnancy, antepartum 01/13/2023   Supervision of high-risk pregnancy, third trimester 12/15/2022   Current moderate 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 prior episode (HCC) 10/13/2019   Mild intermittent asthma without complication 06/14/2019   Prediabetes 06/14/2019   Vitamin D deficiency 06/14/2019   Vitamin B12 deficiency 06/14/2019   Tobacco use disorder 06/14/2019   Impingement syndrome of left shoulder 06/14/2019    Prenatal Transfer Tool  Maternal Diabetes: Yes:  Diabetes Type:   Insulin/Medication controlled Genetic Screening: Normal Maternal Ultrasounds/Referrals: Normal Fetal Ultrasounds or other Referrals:  None Maternal Substance Abuse:  No Significant Maternal Medications:  Meds include: Other: Insulin Significant Maternal Lab Results: Other: GBS pending   Maternal Medical History:   Past Medical History:  Diagnosis Date   Allergy    seasonal   Anxiety    Asthma    years since attack, allergy based    Past Surgical History:  Procedure Laterality Date   ACHILLES TENDON SURGERY Left 11/07/2021   Procedure: ACHILLES TENDON REPAIR;  Surgeon: Rosetta Posner, DPM;  Location: [facility];  Service: Podiatry;  Laterality: Left;   INCISION AND DRAINAGE Left 05/15/2022   Procedure: INCISION AND DRAINAGE;  Surgeon: Rosetta Posner, DPM;  Location: [facility];  Service: Podiatry;  Laterality: Left;   lymph node removal     TONSILECTOMY/ADENOIDECTOMY WITH MYRINGOTOMY  2012   TONSILLECTOMY     TYMPANOSTOMY TUBE PLACEMENT     WISDOM TOOTH EXTRACTION  2009    Allergies  Allergen Reactions   Erythromycin Hives    Prior to Admission medications   Medication Sig Start Date End Date Taking? Authorizing Provider  albuterol (VENTOLIN HFA) 108 (90 Base) MCG/ACT inhaler Inhale 2 puffs into the lungs every 6 (six) hours as needed for wheezing or shortness of breath. 06/06/22   Reubin Milan, MD  aspirin EC 81 MG tablet Take 81 mg by mouth daily. Swallow whole.    [provider]  cephALEXin (KEFLEX) 500 MG capsule Take 1 capsule (500 mg total) by mouth 2 (two) times daily. Patient not taking: Reported on 06/18/2023 04/20/23   Domenick Gong, MD  Doxylamine-Pyridoxine 10-10 MG TBEC Take 2 tablets by  mouth daily.    [provider]  Prenatal Vit-Fe Fumarate-FA (PRENATAL MULTIVITAMIN) TABS tablet Take 1 tablet by mouth daily at 12 noon.    [provider]  promethazine (PHENERGAN) 25 MG suppository Place 1 suppository (25  mg total) rectally every 6 (six) hours as needed for nausea or vomiting. Patient not taking: Reported on 06/18/2023 04/20/23   Domenick Gong, MD  sertraline (ZOLOFT) 25 MG tablet Take 100 mg by mouth at bedtime. Dr. Billy Coast    [provider]     Prenatal care site:  [facility]  OB History  Gravida Para Term Preterm AB Living  1 0 0 0 0 0  SAB IAB Ectopic Multiple Live Births  0 0 0 0 0    # Outcome Date GA Lbr Len/2nd Weight Sex Type Anes PTL Lv  1 Current              Social History: She  reports that she quit smoking about 1 years ago. Her smoking use included cigarettes. She started smoking about 9 years ago. She has a 3.5 pack-year smoking history. She has never used smokeless tobacco. She reports that she does not currently use alcohol after a past usage of about 1.0 standard drink of alcohol per week. She reports that she does not currently use drugs after having used the following drugs: Marijuana.  Family History: family history includes Depression in her father and mother; Mental illness in her father and mother; Thyroid disease in her mother.   Review of Systems: A full review of systems was performed and negative except as noted in the HPI.     Physical Exam:  Vital Signs: BP (!) 141/86   Temp 97.9 F (36.6 C) (Oral)   LMP 10/04/2022 (Exact Date)   General: no acute distress.  HEENT: normocephalic, atraumatic Heart: regular rate & rhythm Lungs: normal respiratory effort Abdomen: soft, gravid, non-tender;  EFW: 6 1/2 lbs  Pelvic:   External: Normal external female genitalia  Cervix:1-2/80/-1 by RN     Extremities: non-tender, symmetric, No edema bilaterally.  DTRs: 2+/2+  Neurologic: Alert & oriented x 3.    No results found for this or any previous visit (from the past 24 hour(s)).  Pertinent Results:  Prenatal Labs: Blood type/Rh O pos   Antibody screen Negative    Rubella Immune    Varicella Immune  RPR NR    HBsAg Neg    Hep C NR   HIV NR    GC neg  Chlamydia neg  Genetic screening cfDNA negative/AFP neg  1 hour GTT 156  3 hour GTT Unable to do  GBS Pending      FHT:  FHR: 130 bpm, variability: moderate,  accelerations:  Present,  decelerations:  Absent Category/reactivity:  Category I UC:   regular, every 2-3 minutes   Cephalic by Leopolds and SVE   No results found.  Assessment:  Evelyn Rangel is a 33 y.o. G1P0 female at [redacted]w[redacted]d with pPROM.   Plan:  1. Admit to Labor & Delivery - Admission status: Inpatient - Dr. Edison Pace MD notified of admission and plan of care  - Reason for admission: PROM - consents reviewed and obtained  2. Fetal Well being  - Fetal Tracing: Cat 1 - GBS Pending  - Presentation: cephalic confirmed by SVE   3. Routine OB: - Prenatal labs reviewed, as above - Rh positive - CBC, T&S, RPR on admit - Clear liquid diet , saline lock  4. Monitoring of labor  - Contractions monitored with external toco - Pelvis adequate for trial of labor  - Plan for expectant management  - Augmentation with oxytocin as appropriate  - Plan for  continuous fetal monitoring - Maternal pain control as desired - Anticipate vaginal delivery  5. A2GDM - Current regimen NPH 18units at bedtime - CBG q 4 hours  - Will consider insulin coverage for 2 consecutive CBG 120 or greater  6. Elevated blood pressure  - Mild range BP noted  - Asymptomatic  - Preeclampsia labs pending   7. Post Partum Planning: - Infant feeding: breast feeding - Contraception:  Undecided  - Flu vaccine: Given prenatally - Tdap vaccine: Given prenatally - RSV vaccine: Given during pregnancy >/=14 days ago - 05/19/2023  Gustavo Lah, CNM 06/18/23 5:56 AM  Margaretmary Eddy, CNM Certified Nurse Midwife [facility]

## 2023-06-18 NOTE — Progress Notes (Signed)
Labor Progress Note  Evelyn Rangel is a 32 y.o. G1P0 at [redacted]w[redacted]d by LMP admitted for PROM  Subjective: Pt is managing well with UCs  Objective: BP 125/89   Pulse 70   Temp 98.2 F (36.8 C) (Oral)   Resp 17   LMP 10/04/2022 (Exact Date)   Fetal Assessment: FHT:  FHR: 125 bpm, variability: moderate,  accelerations:  Present,  decelerations:  Absent Category/reactivity:  Category I UC:   regular, every 4-9 minutes SVE:    Dilation: 1.5 cm  Effacement: 80%  Station:  -2  Consistency: soft  Position: posterior  Membrane status:pPROM at 0300 Amniotic color: Clear, pink  Labs: Lab Results  Component Value Date   WBC 10.9 (H) 06/18/2023   HGB 10.9 (L) 06/18/2023   HCT 32.1 (L) 06/18/2023   MCV 79.7 (L) 06/18/2023   PLT 394 06/18/2023    Assessment / Plan: Induction of labor due to PROM,  progressing well on pitocin 0300 pPROM 0510 1.5/80/-2 Was not able to start Pitocin until now  Labor: Progressing normally Preeclampsia:   125/89   - PCR 140, CBC and CMP WNL GDM: Insulin dependent  - CBG: 79, 77 Fetal Wellbeing:  Category I Pain Control:  Labor support without medications I/D:   Afebrile, GBS presumptive neg, pPROM x 7 hours  Anticipated MOD:  NSVD  Jenifer E Azaylea Maves, CNM 06/18/2023, 11:21 AM

## 2023-06-18 NOTE — Progress Notes (Signed)
Labor Progress Note  Evelyn Rangel is a 32 y.o. G1P0 at [redacted]w[redacted]d by LMP admitted for PROM  Subjective: Pt is comfortable with her epidural and in all fours  Objective: BP 123/71   Pulse 83   Temp 98.2 F (36.8 C) (Oral)   Resp 17   Ht 5\' 5"  (1.651 m)   Wt 90.3 kg   LMP 10/04/2022 (Exact Date)   SpO2 99%   BMI 33.12 kg/m   Fetal Assessment: FHT:  FHR: 130 bpm, variability: moderate,  accelerations:  Present,  decelerations:  Absent Category/reactivity:  Category I UC:   regular, every 4 minutes SVE:    Dilation: 6 cm  Effacement: 100%  Station:  -1  Consistency: soft  Position: middle  Membrane status:pPROM at 0300 Amniotic color: Clear, pink  Labs: Lab Results  Component Value Date   WBC 10.9 (H) 06/18/2023   HGB 10.9 (L) 06/18/2023   HCT 32.1 (L) 06/18/2023   MCV 79.7 (L) 06/18/2023   PLT 394 06/18/2023    Assessment / Plan: Induction of labor due to PROM,  progressing well on pitocin 0300 pPROM 0510 1.5/80/-2 1136 Pit started 1600 5/100/0  FSE and IUPC placed, Pitocin stopped 1654 Pitocin restarted 1835 Pitocin stopped for variables 1920 6/100/0 Pitocin now at 4mU and MVU 130-200   Labor: Progressing normally Preeclampsia:   123/71  - PCR 140, CBC and CMP WNL GDM: Insulin dependent  - CBG: 79, 77, 75, 92 Fetal Wellbeing:  Category I Pain Control:  Epidural I/D:   Afebrile, GBS neg, pPROM x 16 hours  Anticipated MOD:  NSVD  Cyril Mourning, CNM 06/18/2023, 8:27 PM

## 2023-06-18 NOTE — Discharge Summary (Signed)
Postpartum Discharge Summary  Patient Name: Evelyn Rangel DOB: Dec 19, 1990 MRN: [id number]  Date of admission: 06/18/2023 Delivery date:06/18/2023 Delivering provider: Christeen Douglas Date of discharge: 06/21/2023  Primary OB: [facility] [id number] last menstrual period was 10/04/2022 (exact date). EDC Estimated Date of Delivery: 07/11/23 Gestational Age at Delivery: [redacted]w[redacted]d   Admitting diagnosis: Preterm premature rupture of membranes in third trimester, unspecified duration to onset of labor [O42.913] Intrauterine pregnancy: [redacted]w[redacted]d     Secondary diagnosis:   Principal Problem:   Preterm premature rupture of membranes in third trimester, unspecified duration to onset of labor Active Problems:   Insulin controlled gestational diabetes mellitus (GDM) during pregnancy, antepartum   Obesity affecting pregnancy   Discharge Diagnosis: Preterm Pregnancy Delivered and GDM A2      Hospital course: Induction of Labor With Cesarean Section   32 y.o. yo G1P0101 at [redacted]w[redacted]d was admitted to the hospital 06/18/2023 for induction of labor. Patient had a labor course significant for pPROM and GDMA2. The patient went for cesarean section due to Non-Reassuring FHR. Delivery details are as follows: Membrane Rupture Time/Date: 3:00 AM,06/18/2023  Delivery Method:C-Section, Low Transverse Operative Delivery:N/A Details of operation can be found in separate operative Note.  Patient had a postpartum course without complication. She is ambulating, tolerating a regular diet, passing flatus, and urinating well.  Patient is discharged home in stable condition on 06/21/23.      Newborn Data: Birth date:06/18/2023 Birth time:10:53 PM Gender:Female Living status:Living Apgars:4 ,9  Weight:2240 g                                                                         Post partum procedures: IV Venofer Augmentation:: Pitocin Complications: None Delivery Type: primary cesarean section, low  transverse incision Anesthesia: epidural anesthesia Placenta: spontaneous To Pathology: Yes   Prenatal Labs:  Blood type/Rh O pos   Antibody screen Negative    Rubella Immune    Varicella Immune  RPR NR    HBsAg Neg   Hep C NR   HIV NR    GC neg  Chlamydia neg  Genetic screening cfDNA negative/AFP neg  1 hour GTT 156  3 hour GTT Unable to do  GBS neg       Magnesium Sulfate received: No BMZ received: No Rhophylac:was not indicated MMR: was not indicated Varivax vaccine given: was not indicated T-DaP:Given prenatally Flu: Given prenatally  Transfusion:No  Physical exam  Vitals:   06/20/23 1614 06/20/23 1615 06/21/23 0027 06/21/23 0817  BP: 125/76 125/76 137/81 137/85  Pulse: 65 65 85 79  Resp:  18 20 20   Temp: 98.1 F (36.7 C) 98 F (36.7 C) 98.3 F (36.8 C) 98.2 F (36.8 C)  TempSrc: Oral  Oral Oral  SpO2: 100% 100% 99% 100%  Weight:      Height:       General: alert, cooperative, and no distress Lochia: appropriate Uterine Fundus: firm Incision: Healing well with no significant drainage, No significant erythema, Dressing is clean, dry, and intact, covered with occlusive OP site dressing   DVT Evaluation: No evidence of DVT seen on physical exam.  Labs: Lab Results  Component Value Date   WBC 7.9 06/21/2023   HGB 7.9 (L) 06/21/2023  HCT 24.0 (L) 06/21/2023   MCV 81.6 06/21/2023   PLT 300 06/21/2023      Latest Ref Rng & Units 06/18/2023    6:12 AM  CMP  Glucose 70 - 99 mg/dL 87   BUN 6 - 20 mg/dL 7   Creatinine 4.09 - 8.11 mg/dL 9.14   Sodium 782 - 956 mmol/L 135   Potassium 3.5 - 5.1 mmol/L 3.6   Chloride 98 - 111 mmol/L 104   CO2 22 - 32 mmol/L 19   Calcium 8.9 - 10.3 mg/dL 8.7   Total Protein 6.5 - 8.1 g/dL 6.7   Total Bilirubin <2.1 mg/dL <3.0   Alkaline Phos 38 - 126 U/L 143   AST 15 - 41 U/L 18   ALT 0 - 44 U/L 17    Edinburgh Score:    06/21/2023   12:35 AM  Edinburgh Postnatal Depression Scale Screening Tool  I have been  able to laugh and see the funny side of things. 1  I have looked forward with enjoyment to things. 1  I have blamed myself unnecessarily when things went wrong. 2  I have been anxious or worried for no good reason. 2  I have felt scared or panicky for no good reason. 1  Things have been getting on top of me. 1  I have been so unhappy that I have had difficulty sleeping. 1  I have felt sad or miserable. 1  I have been so unhappy that I have been crying. 1  The thought of harming myself has occurred to me. 0  Edinburgh Postnatal Depression Scale Total 11    Risk assessment for postpartum VTE and prophylactic treatment: Very high risk factors: None High risk factors: Unscheduled cesarean after labor  Moderate risk factors: Cesarean delivery  and BMI 30-40 kg/m2  Postpartum VTE prophylaxis with LMWH not indicated  After visit meds:  Allergies as of 06/21/2023       Reactions   Erythromycin Hives        Medication List     STOP taking these medications    aspirin EC 81 MG tablet   cephALEXin 500 MG capsule Commonly known as: KEFLEX   Doxylamine-Pyridoxine 10-10 MG Tbec   promethazine 25 MG suppository Commonly known as: PHENERGAN       TAKE these medications    acetaminophen 500 MG tablet Commonly known as: TYLENOL Take 2 tablets (1,000 mg total) by mouth every 6 (six) hours as needed for mild pain (pain score 1-3) or fever.   albuterol 108 (90 Base) MCG/ACT inhaler Commonly known as: VENTOLIN HFA Inhale 2 puffs into the lungs every 6 (six) hours as needed for wheezing or shortness of breath.   ferrous sulfate 325 (65 FE) MG tablet Take 1 tablet (325 mg total) by mouth 3 (three) times a week. Start taking on: June 22, 2023   ibuprofen 600 MG tablet Commonly known as: ADVIL Take 1 tablet (600 mg total) by mouth every 6 (six) hours as needed for fever, mild pain (pain score 1-3) or cramping.   oxyCODONE 5 MG immediate release tablet Commonly known as:  Oxy IR/ROXICODONE Take 1-2 tablets (5-10 mg total) by mouth every 4 (four) hours as needed for up to 7 days for severe pain (pain score 7-10).   prenatal multivitamin Tabs tablet Take 1 tablet by mouth daily at 12 noon.   senna 8.6 MG Tabs tablet Commonly known as: SENOKOT Take 1 tablet (8.6 mg total) by mouth at bedtime  as needed for mild constipation.   sertraline 25 MG tablet Commonly known as: ZOLOFT Take 100 mg by mouth at bedtime. Dr. Billy Coast   simethicone 80 MG chewable tablet Commonly known as: MYLICON Chew 1 tablet (80 mg total) by mouth 4 (four) times daily as needed for flatulence.       Discharge home in stable condition Infant Feeding: Breast Infant Disposition:NICU Discharge instruction: per After Visit Summary and Postpartum booklet. Activity: Advance as tolerated. Pelvic rest for 6 weeks.  Diet: routine diet Anticipated Birth Control: Unsure Postpartum Appointment:6 weeks Additional Postpartum F/U: Incision check 2 weeks Future Appointments:No future appointments. Follow up Visit:  Follow-up Information     [facility] Follow up in 2 day(s).   Why: BP check Contact information: [address] [place] [address] [phone]        Christeen Douglas, MD Follow up in 2 week(s).   Specialty: Obstetrics and Gynecology Why: 2wk incision check Contact information: [address] [place] [address] [phone]                 Plan:  Niome Velo was discharged to home in good condition. Follow-up appointment as directed.    Signed:  Janyce Llanos, CNM 06/21/2023 10:02 AM

## 2023-06-18 NOTE — Op Note (Signed)
  Cesarean Section Procedure Note  Date of procedure: 06/18/2023   Pre-operative Diagnosis: Intrauterine pregnancy at [redacted]w[redacted]d;  - Non reassuring fetal tracing, remote from delivery - PPROM - gDMA2   Post-operative Diagnosis: same, delivered.  Procedure: Primary Low Transverse Cesarean Section through Pfannenstiel incision  Surgeon: Christeen Douglas, MD  Assistant(s):  Haroldine Laws, CNM   An experienced assistant was required given the standard of surgical care given the complexity of the case.  This assistant was needed for exposure, dissection, suctioning, retraction, instrument exchange,  CNM assisting with delivery with administration of fundal pressure, and for overall help during the procedure.   Anesthesia: Epidural, Local anesthesia 0.5% bupivacaine  Anesthesiologist: Corinda Gubler, MD Anesthesiologist: Stephanie Coup, MD; Corinda Gubler, MD  Estimated Blood Loss:           Drains: Foley         Total IV Fluids:  Urine Output:         Specimens: Placenta, Cord blood, cord gases         Complications:  None; patient tolerated the procedure well.         Disposition: PACU - hemodynamically stable.         Condition: stable  Findings:  A female infant "Evelyn Rangel" in cephalic presentation. Amniotic fluid - Clear  Birth weight 2290 g.   APGAR (1 MIN):  4 APGAR (5 MINS):  9 APGAR (10 MINS):     Intact placenta with a three-vessel cord.  Grossly normal uterus, tubes and ovaries bilaterally. No intraabdominal adhesions were noted.  Long umbilical cord, nuchal and body cord  Indications: non-reassuring fetal status  Procedure Details  The patient was taken to Operating Room, identified as the correct patient and the procedure verified as C-Section Delivery. A formal Time Out was held with all team members present and in agreement.  After induction of anesthesia, the patient was draped and prepped in the usual sterile manner. A Pfannenstiel skin  incision was made and carried down through the subcutaneous tissue to the fascia. Fascial incision was made and extended transversely with the Mayo scissors. The fascia was separated from the underlying rectus tissue superiorly and inferiorly. The peritoneum was identified and entered bluntly. Peritoneal incision was extended longitudinally. The utero-vesical peritoneal reflection was incised transversely and a bladder flap was created digitally.   A low transverse hysterotomy was made. The fetus was delivered atraumatically. The umbilical cord was clamped x2 and cut and the infant was handed to the awaiting pediatricians. The placenta was removed intact and appeared normal, intact, and with a 3-vessel cord.   The uterus was exteriorized and cleared of all clot and debris. The hysterotomy was closed with running sutures of 0-Vicryl. A second imbricating layer was placed with the same suture. Excellent hemostasis was observed. The peritoneal cavity was cleared of all clots and debris. The uterus was returned to the abdomen.   Gutters and pelvis were evaluated and excellent hemostasis was noted. The fascia was then reapproximated with running sutures of 0 Maxon.  The subcutaneous tissue was reapproximated with running sutures of 0 Vicryl. The skin was reapproximated with Ensorb absorbable staples. 30 of 0.5% bupivicaine was placed in the fascial and skin lines.  Instrument, sponge, and needle counts were correct prior to the abdominal closure and at the conclusion of the case.   The patient tolerated the procedure well and was transferred to the recovery room in stable condition.   Christeen Douglas, MD 06/18/2023

## 2023-06-18 NOTE — Progress Notes (Signed)
G1P0 at [redacted]w[redacted]d   Cat II-III strip remote from delivery. Several hours of mod variability with deep decels, now with recurrent lates, no tachy, IUPC and amnioinfusion in place, FSE in place. Pitocin off, not adequate.  8/90/+1, clear fluid  The risks of cesarean section discussed with the patient included but were not limited to: bleeding which may require transfusion or reoperation; infection which may require antibiotics; injury to bowel, bladder, ureters or other surrounding organs; injury to the fetus; need for additional procedures including hysterectomy in the event of a life-threatening hemorrhage; placental abnormalities wth subsequent pregnancies, incisional problems, thromboembolic phenomenon and other postoperative/anesthesia complications. The patient concurred with the proposed plan, giving informed written consent for the procedure. Anesthesia and OR aware. Preoperative prophylactic antibiotics and SCDs ordered on call to the OR.  To OR when ready.

## 2023-06-19 ENCOUNTER — Encounter: Payer: Self-pay | Admitting: Obstetrics and Gynecology

## 2023-06-19 MED ORDER — KETOROLAC TROMETHAMINE 30 MG/ML IJ SOLN
30.0000 mg | Freq: Four times a day (QID) | INTRAMUSCULAR | Status: AC
  Administered 2023-06-19 (×2): 30 mg via INTRAVENOUS
  Filled 2023-06-19 (×3): qty 1

## 2023-06-19 MED ORDER — COCONUT OIL OIL: 1.0000 | TOPICAL_OIL | Status: DC | PRN

## 2023-06-19 MED ORDER — PRENATAL MULTIVITAMIN CH
1.0000 | ORAL_TABLET | Freq: Every day | ORAL | Status: DC
  Administered 2023-06-19 – 2023-06-21 (×3): 1 via ORAL
  Filled 2023-06-19 (×3): qty 1

## 2023-06-19 MED ORDER — SIMETHICONE 80 MG PO CHEW
80.0000 mg | CHEWABLE_TABLET | ORAL | Status: DC | PRN
  Administered 2023-06-20 – 2023-06-21 (×2): 80 mg via ORAL
  Filled 2023-06-19 (×2): qty 1

## 2023-06-19 MED ORDER — SENNA 8.6 MG PO TABS
1.0000 | ORAL_TABLET | Freq: Every day | ORAL | Status: DC
  Administered 2023-06-19 – 2023-06-20 (×2): 8.6 mg via ORAL
  Filled 2023-06-19 (×3): qty 1

## 2023-06-19 MED ORDER — DIPHENHYDRAMINE HCL 25 MG PO CAPS
25.0000 mg | ORAL_CAPSULE | Freq: Four times a day (QID) | ORAL | Status: DC | PRN
  Administered 2023-06-19: 25 mg via ORAL
  Filled 2023-06-19: qty 1

## 2023-06-19 MED ORDER — SERTRALINE HCL 100 MG PO TABS
100.0000 mg | ORAL_TABLET | Freq: Every day | ORAL | Status: DC
  Administered 2023-06-19 – 2023-06-20 (×2): 100 mg via ORAL
  Filled 2023-06-19 (×2): qty 1

## 2023-06-19 MED ORDER — SIMETHICONE 80 MG PO CHEW
80.0000 mg | CHEWABLE_TABLET | Freq: Three times a day (TID) | ORAL | Status: DC
Start: 2023-06-19 — End: 2023-06-19

## 2023-06-19 MED ORDER — ACETAMINOPHEN 500 MG PO TABS
1000.0000 mg | ORAL_TABLET | Freq: Four times a day (QID) | ORAL | Status: DC
  Administered 2023-06-19 – 2023-06-21 (×8): 1000 mg via ORAL
  Filled 2023-06-19 (×10): qty 2

## 2023-06-19 MED ORDER — OXYCODONE HCL 5 MG PO TABS: 5.0000 mg | ORAL_TABLET | ORAL | Status: DC | PRN

## 2023-06-19 MED ORDER — FERROUS SULFATE 325 (65 FE) MG PO TABS
325.0000 mg | ORAL_TABLET | Freq: Two times a day (BID) | ORAL | Status: DC
  Administered 2023-06-19 – 2023-06-21 (×5): 325 mg via ORAL
  Filled 2023-06-19 (×5): qty 1

## 2023-06-19 MED ORDER — SENNOSIDES-DOCUSATE SODIUM 8.6-50 MG PO TABS
2.0000 | ORAL_TABLET | ORAL | Status: DC
  Administered 2023-06-19 – 2023-06-20 (×2): 2 via ORAL
  Filled 2023-06-19 (×2): qty 2

## 2023-06-19 MED ORDER — OXYTOCIN-SODIUM CHLORIDE 30-0.9 UT/500ML-% IV SOLN: 2.5000 [IU]/h | INTRAVENOUS | Status: AC

## 2023-06-19 MED ORDER — MENTHOL 3 MG MT LOZG
1.0000 | LOZENGE | OROMUCOSAL | Status: DC | PRN
Start: 2023-06-19 — End: 2023-06-21

## 2023-06-19 MED ORDER — PROMETHAZINE HCL 25 MG RE SUPP: 25.0000 mg | Freq: Four times a day (QID) | RECTAL | Status: DC | PRN

## 2023-06-19 MED ORDER — TETANUS-DIPHTH-ACELL PERTUSSIS 5-2.5-18.5 LF-MCG/0.5 IM SUSY: 0.5000 mL | PREFILLED_SYRINGE | Freq: Once | INTRAMUSCULAR | Status: DC

## 2023-06-19 MED ORDER — LACTATED RINGERS IV SOLN: INTRAVENOUS | Status: AC

## 2023-06-19 MED ORDER — WITCH HAZEL-GLYCERIN EX PADS: 1.0000 | MEDICATED_PAD | CUTANEOUS | Status: DC | PRN

## 2023-06-19 MED ORDER — POLYETHYLENE GLYCOL 3350 17 G PO PACK
17.0000 g | PACK | Freq: Every day | ORAL | Status: DC
  Administered 2023-06-20: 17 g via ORAL
  Filled 2023-06-19: qty 1

## 2023-06-19 MED ORDER — DIBUCAINE (PERIANAL) 1 % EX OINT: 1.0000 | TOPICAL_OINTMENT | CUTANEOUS | Status: DC | PRN

## 2023-06-19 MED ORDER — IBUPROFEN 600 MG PO TABS
600.0000 mg | ORAL_TABLET | Freq: Four times a day (QID) | ORAL | Status: DC
  Administered 2023-06-20 – 2023-06-21 (×7): 600 mg via ORAL
  Filled 2023-06-19 (×8): qty 1

## 2023-06-19 MED ORDER — ALBUTEROL SULFATE (2.5 MG/3ML) 0.083% IN NEBU: 2.5000 mg | INHALATION_SOLUTION | Freq: Four times a day (QID) | RESPIRATORY_TRACT | Status: DC | PRN

## 2023-06-19 MED ORDER — MEASLES, MUMPS & RUBELLA VAC IJ SOLR
0.5000 mL | Freq: Once | INTRAMUSCULAR | Status: DC
Start: 2023-06-19 — End: 2023-06-21
  Filled 2023-06-19: qty 0.5

## 2023-06-19 MED ORDER — BISACODYL 10 MG RE SUPP: 10.0000 mg | Freq: Every day | RECTAL | Status: DC | PRN

## 2023-06-19 MED ORDER — FLEET ENEMA RE ENEM: 1.0000 | ENEMA | Freq: Every day | RECTAL | Status: DC | PRN

## 2023-06-19 MED ORDER — GABAPENTIN 300 MG PO CAPS
300.0000 mg | ORAL_CAPSULE | Freq: Every day | ORAL | Status: DC
  Administered 2023-06-19 – 2023-06-20 (×2): 300 mg via ORAL
  Filled 2023-06-19 (×2): qty 1

## 2023-06-19 NOTE — Progress Notes (Signed)
Postpartum Day  1  Subjective: 32 y.o. G1P0101 postpartum day #1 status post primary cesarean section. She is ambulating, is tolerating po, is voiding spontaneously.  Her pain is well controlled on PO pain medications. Her lochia is less than menses.  Objective: BP 124/84 (BP Location: Left Arm)   Pulse 80   Temp 98.4 F (36.9 C) (Oral)   Resp 20   Ht 5\' 5"  (1.651 m)   Wt 90.3 kg   LMP 10/04/2022 (Exact Date)   SpO2 99%   Breastfeeding Unknown   BMI 33.12 kg/m    Physical Exam:  General: alert, cooperative, and appears stated age Breasts: soft/nontender Pulm: nl effort Abdomen: soft, non-tender, active bowel sounds Uterine Fundus: firm Incision: healing well, no significant drainage, no dehiscence, no significant erythema Perineum: minimal edema, intact Lochia: appropriate DVT Evaluation: No evidence of DVT seen on physical exam. Negative Homan's sign. No cords or calf tenderness. No significant calf/ankle edema.  Recent Labs    06/18/23 0612  HGB 10.9*  HCT 32.1*  WBC 10.9*  PLT 394    Assessment/Plan: 32 y.o. G1P0101 postpartum day # 1  1. Continue routine postpartum care  2. Infant feeding status: breast and formula feeding --Lactation consult PRN for breastfeeding  3. Contraception plan: bilateral tubal ligation  4. Acute blood loss anemia - clinically significant.  --Hemodynamically stable and asymptomatic --Intervention: start on oral supplementation with ferrous sulfate 325 mg   5. Immunization status:   all immunizations up to date   Disposition: continue inpatient postpartum care , plan for discharge home tomorrow    LOS: 1 day   Chari Manning, CNM 06/19/2023, 4:57 PM   ----- Chari Manning Certified Nurse Midwife [facility]

## 2023-06-19 NOTE — Lactation Note (Signed)
This note was copied from a baby's chart. Lactation Consultation Note  Patient Name: Evelyn Rangel Date: 06/19/2023 Age:31 hours Reason for consult: Initial assessment;Primapara;Late-preterm 34-36.6wks;Infant < 6lbs Lactation rounds  Maternal Data Does the patient have breastfeeding experience prior to this delivery?: No  Feeding Mother's Current Feeding Choice: Breast Milk and Formula Mom states she attempted latched baby to breast after birth but baby was uable to latch and too sleepy, she has not attempted today and may want to try later today if baby is more alert.  She states she has pumped her breasts a few times but only was able to collect drops of colostrum, encouragement provided to continue to pump.  [name] from OT has been in to assist mom with bottlefeeding baby as baby has been feeding poorly with bottle also.  LATCH Score                    Lactation Tools Discussed/Used Tools: Pump Breast pump type: Double-Electric Breast Pump Reason for Pumping: late preterm baby Pumping frequency: instructed to pump breasts q3hr or 8x/24 hrs Pumped volume:  (mom reports drops)  Interventions Interventions: DEBP;Education LC name and no written on white board Discharge Pump: Personal;DEBP WIC Program: No  Consult Status Consult Status: PRN    [name] 06/19/2023, 4:34 PM

## 2023-06-19 NOTE — Progress Notes (Signed)
Pt transferred and moved to PP/mother baby unit room 343. No distress noted, pt oriented to room and unit, baby in arms, arm bands verified. No new needs requested at this time.

## 2023-06-20 LAB — CBC
HCT: 24 % — ABNORMAL LOW (ref 36.0–46.0)
Hemoglobin: 8 g/dL — ABNORMAL LOW (ref 12.0–15.0)
MCH: 27.4 pg (ref 26.0–34.0)
MCHC: 33.3 g/dL (ref 30.0–36.0)
MCV: 82.2 fL (ref 80.0–100.0)
Platelets: 323 10*3/uL (ref 150–400)
RBC: 2.92 MIL/uL — ABNORMAL LOW (ref 3.87–5.11)
RDW: 14.1 % (ref 11.5–15.5)
WBC: 11.5 10*3/uL — ABNORMAL HIGH (ref 4.0–10.5)
nRBC: 0 % (ref 0.0–0.2)

## 2023-06-20 MED ORDER — IRON SUCROSE 300 MG IVPB - SIMPLE MED
300.0000 mg | Freq: Once | Status: AC
  Administered 2023-06-20: 300 mg via INTRAVENOUS
  Filled 2023-06-20: qty 300

## 2023-06-20 NOTE — Addendum Note (Signed)
Addendum  created 06/20/23 0924 by Foye Deer, MD   Clinical Note Signed

## 2023-06-20 NOTE — Progress Notes (Addendum)
Post Operative Day 2 Subjective: Doing well, no complaints.  Tolerating regular diet, pain with PO meds, voiding and ambulating without difficulty.  No CP SOB Fever,Chills, N/V or leg pain; denies nipple or breast pain, no HA change of vision, RUQ/epigastric pain  Objective: BP 122/84 (BP Location: Right Arm)   Pulse 85   Temp 98 F (36.7 C)   Resp 18   Ht 5\' 5"  (1.651 m)   Wt 90.3 kg   LMP 10/04/2022 (Exact Date)   SpO2 99%   Breastfeeding Unknown   BMI 33.12 kg/m    Physical Exam:  General: NAD Breasts: soft/nontender  CV: RRR Pulm: nl effort, CTABL Abdomen: soft, NT, BS x 4 Incision:  Dsg CDI/Honeycomb dressing intact/no erythema or drainage Lochia: moderate Uterine Fundus: fundus firm and 1 fb below umbilicus DVT Evaluation: no cords, ttp LEs   Recent Labs    06/18/23 0612 06/20/23 0518  HGB 10.9* 8.0*  HCT 32.1* 24.0*  WBC 10.9* 11.5*  PLT 394 323   I/O last 3 completed shifts: In: 750 [P.O.:700; IV Piggyback:50] Out: 1700 [Urine:750; Blood:950] No intake/output data recorded.   Assessment/Plan: 32 y.o. G1P0101 post-operative day # 1  - Continue routine PP care - Infant remains in NICU - Appropriate urine output - Lactation consult PRN  - GDM A2: blood sugars stable, do 2hr GTT at 6wk postpartum - Elevated BP: normal pre-eclampsia labs, Bps reassuring, no need for medication at this time - Undecided about postpartum contraception - Acute blood loss anemia, clinically significant - hemoglobin changed from 10.9 to 8.0, patient is asymptomatic, hemodynamically stable; start po ferrous sulfate BID with stool softeners, administer Venofer IV, repeat CBC in the AM - Immunization status: all Imms up to date  Disposition: Does not desire Dc home today.   Janyce Llanos, CNM 06/20/2023 1:17 PM

## 2023-06-20 NOTE — Anesthesia Postprocedure Evaluation (Addendum)
Anesthesia Post Note  Patient: Evelyn Rangel  Procedure(s) Performed: CESAREAN SECTION  Patient location during evaluation: Mother Baby Anesthesia Type: Epidural Level of consciousness: awake and alert Pain management: pain level controlled Vital Signs Assessment: post-procedure vital signs reviewed and stable Respiratory status: spontaneous breathing, nonlabored ventilation and respiratory function stable Cardiovascular status: blood pressure returned to baseline and stable Postop Assessment: no apparent nausea or vomiting Anesthetic complications: no   No notable events documented.   Last Vitals:  Vitals:   06/20/23 0100 06/20/23 0834  BP: 128/78 (!) 142/92  Pulse: 92 74  Resp: 18 20  Temp: 36.7 C 36.8 C  SpO2:  98%    Last Pain:  Vitals:   06/20/23 0834  TempSrc: Oral  PainSc:                  Foye Deer

## 2023-06-20 NOTE — Plan of Care (Signed)
  Problem: Activity: Goal: Risk for activity intolerance will decrease Outcome: Progressing   Problem: Coping: Goal: Level of anxiety will decrease Outcome: Progressing   Problem: Pain Management: Goal: General experience of comfort will improve Outcome: Progressing   Problem: Safety: Goal: Ability to remain free from injury will improve Outcome: Progressing

## 2023-06-21 LAB — CBC
HCT: 24 % — ABNORMAL LOW (ref 36.0–46.0)
Hemoglobin: 7.9 g/dL — ABNORMAL LOW (ref 12.0–15.0)
MCH: 26.9 pg (ref 26.0–34.0)
MCHC: 32.9 g/dL (ref 30.0–36.0)
MCV: 81.6 fL (ref 80.0–100.0)
Platelets: 300 10*3/uL (ref 150–400)
RBC: 2.94 MIL/uL — ABNORMAL LOW (ref 3.87–5.11)
RDW: 14.3 % (ref 11.5–15.5)
WBC: 7.9 10*3/uL (ref 4.0–10.5)
nRBC: 0 % (ref 0.0–0.2)

## 2023-06-21 MED ORDER — ACETAMINOPHEN 500 MG PO TABS: 1000.0000 mg | ORAL_TABLET | Freq: Four times a day (QID) | ORAL | 0 refills | Status: AC | PRN

## 2023-06-21 MED ORDER — IBUPROFEN 600 MG PO TABS: 600.0000 mg | ORAL_TABLET | Freq: Four times a day (QID) | ORAL | 0 refills | Status: DC | PRN

## 2023-06-21 MED ORDER — OXYCODONE HCL 5 MG PO TABS: 5.0000 mg | ORAL_TABLET | ORAL | 0 refills | Status: AC | PRN

## 2023-06-21 MED ORDER — SIMETHICONE 80 MG PO CHEW: 80.0000 mg | CHEWABLE_TABLET | Freq: Four times a day (QID) | ORAL | 0 refills | Status: DC | PRN

## 2023-06-21 MED ORDER — FERROUS SULFATE 325 (65 FE) MG PO TABS: 325.0000 mg | ORAL_TABLET | ORAL | 3 refills | Status: DC

## 2023-06-21 MED ORDER — SENNA 8.6 MG PO TABS: 1.0000 | ORAL_TABLET | Freq: Every evening | ORAL | 0 refills | Status: DC | PRN

## 2023-06-21 NOTE — Lactation Note (Signed)
This note was copied from a baby's chart. Lactation Consultation Note  Patient Name: Evelyn Rangel [id number] Date: 06/21/2023 Age:31 hours Reason for consult: Follow-up assessment;Primapara;Late-preterm 34-36.6wks;Infant < 6lbs   Maternal Data This is mom's 1st baby, C/S for non  reassuring FHR. Mom with history of GDM insulin controlled, anxiety, and depression. Baby  born at 10 5/7 weeks admitted to [facility] for persistent hypoglycemia.  On follow-up visit today mom reports she has been consistently pumping and obtaining measurable amounts of colostrum that has increased in volume.  Has patient been taught Hand Expression?: Yes Does the patient have breastfeeding experience prior to this delivery?: No  Feeding Mother's Current Feeding Choice: Breast Milk and Formula Nipple Type: Dr. Lorne Skeens  Discussed with mom characteristics of LPT infants and breastfeeding and potential for inconsistent breastfeeding until baby's original due date. Mom reports using the nipple shield has been somewhat challenging however baby did latch yesterday and feed with a nipple shield. Care nurse assisted mom. Mom has LC number on white board if mom would like assistance with breastfeeding and use of nipple shield today.  Lactation Tools Discussed/Used Tools: Bottle Breast pump type: Double-Electric Breast Pump Pump Education: Other (comment) (Per mom's report baby possibly will go home with her today or baby may be rooming in with mom overnight. Discussed with mom pumping plan to maximize establishment  of her milk supply.) Reason for Pumping: LPT baby in SCN Pumping frequency: 8 times/24 hours Pumped volume:  (Per mom she has been pumping 15-18 ml's per pump session.)  Interventions Interventions: Education  Discharge Discharge Education: Engorgement and breast care;Warning signs for feeding baby;Outpatient recommendation;Other (comment) (Assisted mom with assessment of her bra for proper  fit.) Pump: Personal  Consult Status Consult Status: PRN Date: 06/21/23 Follow-up type: In-patient  Update provided to care nurse.   Fuller Song 06/21/2023, 11:44 AM

## 2023-06-21 NOTE — Lactation Note (Signed)
This note was copied from a baby's chart. Lactation Consultation Note  Patient Name: Evelyn Rangel [id number] Date: 06/21/2023 Age:31 hours Reason for consult: Follow-up assessment;Primapara;Late-preterm 34-36.6wks;Infant < 6lbs    Maternal Data  This is mom's 1st baby, C/S for non  reassuring FHR. Mom with history of GDM insulin controlled, anxiety, and depression. Baby  born at 59 5/7 weeks admitted to [facility] for persistent hypoglycemia.  On follow-up mom reports she will be rooming in with baby overnight.Reviewed and provided plan.  Feeding  Mother's Current Feeding Choice: Breast Milk and Formula Nipple Type: Dr. Lorne Skeens  Lactation Tools Discussed/Used  DEBP  Interventions:  Education: Provided and reviewed LPT care plan with mom. Mom verbalized understanding and is in agreement with plan.  Late Preterm Feeding Plan:   -Offer your baby to eat at least every 3 hours. If your baby awakens showing feeding cues before 3 hours offer your baby to eat. Feeding cues include baby putting her hands to mouth, light sleep, fussiness.  - If your baby has not shown any feeding cues and it has been 3 hours from the beginning of the previous feeding unwrap the baby, change her diaper, attempt to arouse her and offer her to eat.  -When offering your baby to breastfeed use the nipple shield to assist the baby latch to the breast. Support and "sandwich" your breast, tap the baby's mouth with the nipple and encourage your baby to open her mouth wide. Once latched look to make sure your baby's lips are rolled outward on to the areola. When the baby is sucking the nipple portion of the nipple shield will not be exposed if the baby has her lips correctly place on to your areola.   -If you have attempted to latch the baby and she will not latch and feed at the breast, she falls asleep despite encouraging her to suckle and you have attempted for 5-10 minutes stop the breastfeeding attempt and offer  any expressed breastmilk and the formula supplement.   -For now if the baby does latch and is actively sucking encourage the baby to feed at the breast for a time limited period of 15 minutes.  -After each breastfeeding and/or breastfeeding attempt offer baby at least 30 ml's of expressed breastmilk and/or the formula supplement. The baby can take more if she is still hungry.  -Please limit the total feeding time to 30 minutes to conserve the baby's energy.  -Remember to burp the baby following her cues of slowing down or not continuing to eat.  -Continue to use the slow flow bottle and the paced bottle-feeding technique when supplementing baby.  - Mom will post pump after breastfeeding/breastfeeding attempts. If mom chooses to provide baby a bottle feed at a feeding session mom will pump after she completes bottle feeding the baby.  - Mom will start off the pump in the stimulation phase(faster rate of cycles per minute) and once the milk let's down mom will pump in the expression phase(slower rate of cycles per minute)  -If the milk is slowing when pumping mom can consider pressing the stimulation button on her pump to pump at the faster rate of cycles per minute and try to encourage another let-down of milk. Once the milk begins to let-down again mom can pump at the expression slower rate. Some moms find doing this 2-3 times per pump session increases milk production.  -Freshly pumped breastmilk is good at room temperature for 4 hours, in the refrigerator for 4  days, and in the freezer for 6 months.   -It is recommended to keep feeding diary and wet/stool diapers and take to first pediatric check-up. Expect at least 8 feeding in 24 hours, and at least 2-3 stool diapers in 24 hours. Refer to the feeding diary sheet for the number of wet diapers to expect each day. By day 5 expect the baby to have at least 6 wet diapers and at least 2 stools in 24 hours.  -As your baby gets closer to her  original due date she will become more effective at removing milk from your breast. Over the next few weeks after a breastfeeding you will notice your baby take less and less of the supplemental milk as she transitions to more full feedings at your breast.  -Once home if baby refuses 2 feedings in a row (6 hours) or you note no stool diapers in 24 hours call the baby's pediatrician. If you need additional breastfeeding assistance once you go home you can call [facility] lactation at [phone] for questions and to make an outpatient [facility] appointment.  Discharge  Discharge Education: Engorgement and breast care;Warning signs for feeding baby;Outpatient recommendation; reviewed LPT care plan, provided written plan.  Consult Status  Follow-up  06/21/24 in-patient  Update provided to [facility] care nurse.   Fuller Song 06/21/2023, 5:01 PM

## 2023-06-21 NOTE — Clinical Social Work Maternal (Signed)
  CLINICAL SOCIAL WORK MATERNAL/CHILD NOTE  Patient Details  Name: Evelyn Rangel MRN: [id number] Date of Birth: 20-Jul-1991  Date:  06/21/2023   CSW received a consult for [place]  CSW spoke with RN  prior to meeting with MOB. Per RN, No additional concerns  CSW met with MOB at bedside. Explained HIPPA and MOB elected for visitor to leave during assessment. Explained CSW's role and reason for referral.  MOB reported she is feeling "good" due to news about the baby's DC & post delivery. MOB was alert/appropriate/attentive to during assessment.   Confirmed contact information for MOB. MOB and Baby will be living with dad at discharge.   MOB reported she does not receive any WIC/Food Stamps/other. MOB plans to use resources for peds in [place] given from 3rd floor nurses for [facility] MOB reported she has a crib/bassinett/pack and play), car seat (new/used), clothing, diapers, and all other items needed for Baby. MOB reported she has reliable transportation for herself and Baby. MOB denied resource needs at this time.   MOB reported she has GAD & OCD mental health history. MOB reported medicinal & counseling mental health treatments. She attends counseling session weekly and plan to have one soon after DC. MOB reported she has a good support system and is coping well emotionally at this time. MOB denied SI, HI, or DV. MOB denied the need for mental health support resources at this time, reported she is aware of resources if needed.  CSW provided education and information sheets on PPD and SIDS. MOB verbalized understanding. CSW ecouraged MOB to reach out to her Provider with any questions or needs for support or resources, even after discharge.   MOB denied any needs or questions at this time. CSW encouraged MOB to reach out if any arise prior to discharge.   Please re consult CSW if any additional needs or concerns arise.

## 2023-06-21 NOTE — Progress Notes (Signed)
Discharge instructions reviewed with patient.  Follow up care reviewed and questions answered.  Printed copies given to patient for reference after discharge home.

## 2023-06-21 NOTE — Discharge Instructions (Signed)
Call the office as soon as possible to schedule a blood pressure check in 2 days and an incision check in 2 weeks.

## 2023-06-22 NOTE — Anesthesia Procedure Notes (Deleted)
Epidural

## 2023-06-22 NOTE — Addendum Note (Signed)
Addendum  created 06/22/23 0746 by Stephanie Coup, MD   Child order released for a procedure order, Clinical Note Signed, Delete clinical note, Intraprocedure Blocks edited, LDA created via procedure documentation, LDA removed via procedure documentation, LDA updated via procedure documentation, Order Canceled from Note, SmartForm saved

## 2023-06-22 NOTE — Anesthesia Procedure Notes (Addendum)
Epidural Patient location during procedure: OB Start time: 06/18/2023 3:35 PM End time: 06/18/2023 3:42 PM  Staffing Anesthesiologist: Stephanie Coup, MD Performed: anesthesiologist   Preanesthetic Checklist Completed: patient identified, IV checked, site marked, risks and benefits discussed, surgical consent, monitors and equipment checked, pre-op evaluation and timeout performed  Epidural Patient position: sitting Prep: Betadine Patient monitoring: heart rate, continuous pulse ox and blood pressure Approach: midline Location: L3-L4 Injection technique: LOR saline  Needle:  Needle type: Tuohy  Needle gauge: 18 G Needle length: 9 cm and 9 Needle insertion depth: 6 cm Catheter type: closed end flexible Catheter size: 20 Guage Catheter at skin depth: 11 cm Test dose: negative and 1.5% lidocaine with Epi 1:200 K  Assessment Sensory level: T4 Events: blood not aspirated, no cerebrospinal fluid, injection not painful, no injection resistance, no paresthesia and negative IV test  Additional Notes 1 attempt Pt. Evaluated and documentation done after procedure finished. Patient identified. Risks/Benefits/Options discussed with patient including but not limited to bleeding, infection, nerve damage, paralysis, failed block, incomplete pain control, headache, blood pressure changes, nausea, vomiting, reactions to medication both or allergic, itching and postpartum back pain. Confirmed with bedside nurse the patient's most recent platelet count. Confirmed with patient that they are not currently taking any anticoagulation, have any bleeding history or any family history of bleeding disorders. Patient expressed understanding and wished to proceed. All questions were answered. Sterile technique was used throughout the entire procedure. Please see nursing notes for vital signs. Test dose was given through epidural catheter and negative prior to continuing to dose epidural or start infusion.  Warning signs of high block given to the patient including shortness of breath, tingling/numbness in hands, complete motor block, or any concerning symptoms with instructions to call for help. Patient was given instructions on fall risk and not to get out of bed. All questions and concerns addressed with instructions to call with any issues or inadequate analgesia.    Patient tolerated the insertion well without immediate complications. Reason for block:procedure for pain

## 2023-06-23 DIAGNOSIS — F411 Generalized anxiety disorder: Secondary | ICD-10-CM | POA: Diagnosis not present

## 2023-06-29 ENCOUNTER — Other Ambulatory Visit: Payer: Self-pay

## 2023-06-29 ENCOUNTER — Emergency Department
Admission: EM | Admit: 2023-06-29 | Discharge: 2023-06-29 | Disposition: A | Discharge status: Home / Self Care | Payer: BC Managed Care – PPO | Attending: Emergency Medicine | Admitting: Emergency Medicine

## 2023-06-29 DIAGNOSIS — J45909 Unspecified asthma, uncomplicated: Secondary | ICD-10-CM | POA: Diagnosis not present

## 2023-06-29 DIAGNOSIS — R030 Elevated blood-pressure reading, without diagnosis of hypertension: Secondary | ICD-10-CM | POA: Diagnosis not present

## 2023-06-29 DIAGNOSIS — I10 Essential (primary) hypertension: Secondary | ICD-10-CM | POA: Insufficient documentation

## 2023-06-29 LAB — CBC
HCT: 38.6 % (ref 36.0–46.0)
Hemoglobin: 12.2 g/dL (ref 12.0–15.0)
MCH: 27.3 pg (ref 26.0–34.0)
MCHC: 31.6 g/dL (ref 30.0–36.0)
MCV: 86.4 fL (ref 80.0–100.0)
Platelets: 547 10*3/uL — ABNORMAL HIGH (ref 150–400)
RBC: 4.47 MIL/uL (ref 3.87–5.11)
RDW: 15.4 % (ref 11.5–15.5)
WBC: 10 10*3/uL (ref 4.0–10.5)
nRBC: 0 % (ref 0.0–0.2)

## 2023-06-29 LAB — URINALYSIS, ROUTINE W REFLEX MICROSCOPIC
Bilirubin Urine: NEGATIVE
Glucose, UA: NEGATIVE mg/dL
Ketones, ur: 5 mg/dL — AB
Nitrite: NEGATIVE
Protein, ur: NEGATIVE mg/dL
Specific Gravity, Urine: 1.027 (ref 1.005–1.030)
pH: 5 (ref 5.0–8.0)

## 2023-06-29 LAB — BASIC METABOLIC PANEL
Anion gap: 9 (ref 5–15)
BUN: 14 mg/dL (ref 6–20)
CO2: 24 mmol/L (ref 22–32)
Calcium: 9.1 mg/dL (ref 8.9–10.3)
Chloride: 105 mmol/L (ref 98–111)
Creatinine, Ser: 0.78 mg/dL (ref 0.44–1.00)
GFR, Estimated: >60 mL/min (ref >60)
Glucose, Bld: 106 mg/dL — ABNORMAL HIGH (ref 70–99)
Potassium: 4.1 mmol/L (ref 3.5–5.1)
Sodium: 138 mmol/L (ref 135–145)

## 2023-06-29 LAB — HEPATIC FUNCTION PANEL
ALT: 32 U/L (ref 0–44)
AST: 28 U/L (ref 15–41)
Albumin: 3.9 g/dL (ref 3.5–5.0)
Alkaline Phosphatase: 93 U/L (ref 38–126)
Bilirubin, Direct: 0.1 mg/dL (ref 0.0–0.2)
Indirect Bilirubin: 0.3 mg/dL (ref 0.3–0.9)
Total Bilirubin: 0.4 mg/dL (ref <1.2)
Total Protein: 7.6 g/dL (ref 6.5–8.1)

## 2023-06-29 MED ORDER — NIFEDIPINE ER OSMOTIC RELEASE 30 MG PO TB24
30.0000 mg | ORAL_TABLET | Freq: Once | ORAL | Status: AC
  Administered 2023-06-29: 30 mg via ORAL
  Filled 2023-06-29: qty 1

## 2023-06-29 MED ORDER — NIFEDIPINE ER OSMOTIC RELEASE 30 MG PO TB24: 30.0000 mg | ORAL_TABLET | Freq: Every day | ORAL | 11 refills | Status: DC

## 2023-06-29 NOTE — ED Triage Notes (Signed)
Pt sts that her OBGYN said that she could possibly have post partum pre eclampsia and is to get some labs done.

## 2023-06-29 NOTE — ED Provider Notes (Signed)
[facility] Provider Note    Event Date/Time   First MD Initiated Contact with Patient 06/29/23 1851     (approximate)   History   Hypertension   HPI  Evelyn Rangel is a 32 y.o. female with history of asthma, anxiety, and recent C-section on 06/18/2023 presents emergency department with concerns of postpartum preeclampsia.  Was seen at [facility], they had concerns that her blood pressure was a little elevated, she had a headache and some right-sided flank pain.      Physical Exam   Triage Vital Signs: ED Triage Vitals  Encounter Vitals Group     BP 06/29/23 1626 (!) 130/95     Systolic BP Percentile --      Diastolic BP Percentile --      Pulse Rate 06/29/23 1626 (!) 114     Resp 06/29/23 1626 17     Temp 06/29/23 1626 98.1 F (36.7 C)     Temp Source 06/29/23 1626 Oral     SpO2 06/29/23 1626 100 %     Weight 06/29/23 1624 174 lb (78.9 kg)     Height 06/29/23 1624 5\' 5"  (1.651 m)     Head Circumference --      Peak Flow --      Pain Score 06/29/23 1624 0     Pain Loc --      Pain Education --      Exclude from Growth Chart --     Most recent vital signs: Vitals:   06/29/23 2050 06/29/23 2155  BP: (!) 124/91 (!) 125/91  Pulse: 91 87  Resp: 18 18  Temp: 98.7 F (37.1 C) 98.6 F (37 C)  SpO2: 100% 100%     General: Awake, no distress.   CV:  Good peripheral perfusion. regular rate and  rhythm Resp:  Normal effort. Lungs CTA Abd:  No distention.   Other:      ED Results / Procedures / Treatments   Labs (all labs ordered are listed, but only abnormal results are displayed) Labs Reviewed  CBC - Abnormal; Notable for the following components:      Result Value   Platelets 547 (*)    All other components within normal limits  BASIC METABOLIC PANEL - Abnormal; Notable for the following components:   Glucose, Bld 106 (*)    All other components within normal limits  URINALYSIS, ROUTINE W REFLEX MICROSCOPIC - Abnormal;  Notable for the following components:   Color, Urine YELLOW (*)    APPearance HAZY (*)    Hgb urine dipstick LARGE (*)    Ketones, ur 5 (*)    Leukocytes,Ua TRACE (*)    Bacteria, UA RARE (*)    All other components within normal limits  HEPATIC FUNCTION PANEL     EKG     RADIOLOGY     PROCEDURES:   Procedures   MEDICATIONS ORDERED IN ED: Medications  NIFEdipine (PROCARDIA-XL/NIFEDICAL-XL) 24 hr tablet 30 mg (30 mg Oral Given 06/29/23 2153)     IMPRESSION / MDM / ASSESSMENT AND PLAN / ED COURSE  I reviewed the triage vital signs and the nursing notes.                              Differential diagnosis includes, but is not limited to, hypertension, postpartum preeclampsia, fluid retention, elevated blood pressure  Patient's presentation is most consistent with acute presentation with potential threat  to life or bodily function.   Patient's labs are reassuring  Patient's blood pressure has remained elevated although she does not complain of headache etc.  Consult to OB/GYN, spoke with Donato Schultz, CNM, recommends if blood pressure remains elevated to place her on Procardia.  Patient was given a dose of Procardia here in the ED.  Follow-up with her OB/GYN doctor on Thursday of this week.  She already has an appointment.  Patient is in agreement treatment plan.  She is discharged stable condition.      FINAL CLINICAL IMPRESSION(S) / ED DIAGNOSES   Final diagnoses:  Hypertension, unspecified type     Rx / DC Orders   ED Discharge Orders          Ordered    NIFEdipine (PROCARDIA-XL/NIFEDICAL-XL) 30 MG 24 hr tablet  Daily        06/29/23 2122             Note:  This document was prepared using Dragon voice recognition software and may include unintentional dictation errors.    Faythe Ghee, PA-C 06/29/23 2338    Merwyn Katos, MD 07/01/23 [id number]

## 2023-07-01 DIAGNOSIS — F411 Generalized anxiety disorder: Secondary | ICD-10-CM | POA: Diagnosis not present

## 2023-07-02 DIAGNOSIS — O135 Gestational [pregnancy-induced] hypertension without significant proteinuria, complicating the puerperium: Secondary | ICD-10-CM | POA: Diagnosis not present

## 2023-07-11 ENCOUNTER — Inpatient Hospital Stay: Admit: 2023-07-11 | Payer: BC Managed Care – PPO

## 2023-07-16 DIAGNOSIS — F411 Generalized anxiety disorder: Secondary | ICD-10-CM | POA: Diagnosis not present

## 2023-07-28 DIAGNOSIS — F411 Generalized anxiety disorder: Secondary | ICD-10-CM | POA: Diagnosis not present

## 2023-08-04 DIAGNOSIS — Z1332 Encounter for screening for maternal depression: Secondary | ICD-10-CM | POA: Diagnosis not present

## 2023-08-12 DIAGNOSIS — F411 Generalized anxiety disorder: Secondary | ICD-10-CM | POA: Diagnosis not present

## 2023-08-19 DIAGNOSIS — F411 Generalized anxiety disorder: Secondary | ICD-10-CM | POA: Diagnosis not present

## 2023-08-26 DIAGNOSIS — F411 Generalized anxiety disorder: Secondary | ICD-10-CM | POA: Diagnosis not present

## 2023-08-31 DIAGNOSIS — F411 Generalized anxiety disorder: Secondary | ICD-10-CM | POA: Diagnosis not present

## 2023-09-02 ENCOUNTER — Ambulatory Visit: Payer: BC Managed Care – PPO | Admitting: Internal Medicine

## 2023-09-02 ENCOUNTER — Encounter: Payer: Self-pay | Admitting: Internal Medicine

## 2023-09-02 VITALS — BP 112/72 | HR 95 | Ht 65.0 in | Wt 179.0 lb

## 2023-09-02 DIAGNOSIS — R232 Flushing: Secondary | ICD-10-CM

## 2023-09-02 NOTE — Progress Notes (Signed)
 Date:  09/02/2023   Name:  Evelyn Rangel   DOB:  1991-07-22   MRN:  [id number]   Chief Complaint: Hypertension, Hot Flashes, and Face Problem (X after pregnancy In cheeks, face flush and felt hot, when getting out the shower face is blotchy and red )  Hypertension The problem has been resolved (she was able to come off of nifedipine  a month ago.) since onset. Pertinent negatives include no chest pain, headaches, palpitations or shortness of breath. There are no associated agents to hypertension. Past treatments include lifestyle changes. The current treatment provides significant improvement. There is no history of kidney disease, CAD/MI or CVA.    Review of Systems  Constitutional:  Negative for chills and fever.  Respiratory:  Negative for chest tightness and shortness of breath.   Cardiovascular:  Negative for chest pain and palpitations.  Skin:  Positive for color change (face gets very red and spotchy, esp after a hot shower).  Neurological:  Negative for dizziness, light-headedness and headaches.     Lab Results  Component Value Date   NA 138 06/29/2023   K 4.1 06/29/2023   CO2 24 06/29/2023   GLUCOSE 106 (H) 06/29/2023   BUN 14 06/29/2023   CREATININE 0.78 06/29/2023   CALCIUM 9.1 06/29/2023   GFRNONAA >60 06/29/2023   Lab Results  Component Value Date   CHOL 216 (H) 10/13/2019   HDL 44 10/13/2019   LDLCALC 149 (H) 10/13/2019   TRIG 125 10/13/2019   CHOLHDL 4.9 (H) 10/13/2019   Lab Results  Component Value Date   TSH 2.80 06/09/2019   Lab Results  Component Value Date   HGBA1C 5.5 10/13/2019   Lab Results  Component Value Date   WBC 10.0 06/29/2023   HGB 12.2 06/29/2023   HCT 38.6 06/29/2023   MCV 86.4 06/29/2023   PLT 547 (H) 06/29/2023   Lab Results  Component Value Date   ALT 32 06/29/2023   AST 28 06/29/2023   ALKPHOS 93 06/29/2023   BILITOT 0.4 06/29/2023   Lab Results  Component Value Date   VD25OH 32.7 10/13/2019     Patient Active  Problem List   Diagnosis Date Noted   Preterm premature rupture of membranes in third trimester, unspecified duration to onset of labor 06/18/2023   Obesity affecting pregnancy 06/18/2023   Anxiety 05/13/2023   History of infertility 05/13/2023   Insulin controlled gestational diabetes mellitus (GDM) during pregnancy, antepartum 01/13/2023   Supervision of high-risk pregnancy, third trimester 12/15/2022   Current moderate 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 prior episode (HCC) 10/13/2019   Mild intermittent asthma without complication 06/14/2019   Prediabetes 06/14/2019   Vitamin D  deficiency 06/14/2019   Vitamin B12 deficiency 06/14/2019   Tobacco use disorder 06/14/2019   Impingement syndrome of left shoulder 06/14/2019    Allergies  Allergen Reactions   Erythromycin Hives    Past Surgical History:  Procedure Laterality Date   ACHILLES TENDON SURGERY Left 11/07/2021   Procedure: ACHILLES TENDON REPAIR;  Surgeon: Lennie Barter, DPM;  Location: [facility];  Service: Podiatry;  Laterality: Left;   CESAREAN SECTION  06/18/2023   Procedure: CESAREAN SECTION;  Surgeon: Verdon Keen, MD;  Location: [facility];  Service: Obstetrics;;   INCISION AND DRAINAGE Left 05/15/2022   Procedure: INCISION AND DRAINAGE;  Surgeon: Lennie Barter, DPM;  Location: [facility];  Service: Podiatry;  Laterality: Left;   lymph node removal     TONSILECTOMY/ADENOIDECTOMY WITH MYRINGOTOMY  2012  TONSILLECTOMY     TYMPANOSTOMY TUBE PLACEMENT     WISDOM TOOTH EXTRACTION  2009    Social History   Tobacco Use   Smoking status: Former    Current packs/day: 0.00    Average packs/day: 0.5 packs/day for 7.0 years (3.5 ttl pk-yrs)    Types: Cigarettes    Start date: 06/19/2014    Quit date: 06/19/2021    Years since quitting: 2.2   Smokeless tobacco: Never  Vaping Use   Vaping status: Some Days  Substance Use Topics   Alcohol use: Not Currently    Alcohol/week: 1.0 standard  drink of alcohol    Types: 1 Standard drinks or equivalent per week   Drug use: Not Currently    Types: Marijuana     Medication list has been reviewed and updated.  Current Meds  Medication Sig   acetaminophen  (TYLENOL ) 500 MG tablet Take 2 tablets (1,000 mg total) by mouth every 6 (six) hours as needed for mild pain (pain score 1-3) or fever.   albuterol  (VENTOLIN  HFA) 108 (90 Base) MCG/ACT inhaler Inhale 2 puffs into the lungs every 6 (six) hours as needed for wheezing or shortness of breath.   ibuprofen  (ADVIL ) 600 MG tablet Take 1 tablet (600 mg total) by mouth every 6 (six) hours as needed for fever, mild pain (pain score 1-3) or cramping.   sertraline  (ZOLOFT ) 100 MG tablet Take 100 mg by mouth at bedtime. Dr. Orvis Matsu   [DISCONTINUED] ferrous sulfate  325 (65 FE) MG tablet Take 1 tablet (325 mg total) by mouth 3 (three) times a week.       09/02/2023    4:35 PM 06/06/2022    1:56 PM 05/10/2021    3:48 PM 08/07/2020   11:23 AM  GAD 7 : Generalized Anxiety Score  Nervous, Anxious, on Edge 2 0 1 0  Control/stop worrying 1 0 1 0  Worry too much - different things 1 0 0 0  Trouble relaxing 1 0 1 0  Restless 1 0 0 0  Easily annoyed or irritable 1 0 0 0  Afraid - awful might happen 1 0 0 0  Total GAD 7 Score 8 0 3 0  Anxiety Difficulty Somewhat difficult Not difficult at all Not difficult at all Not difficult at all       09/02/2023    4:35 PM 02/18/2023    4:18 PM 01/05/2023    4:04 PM  Depression screen PHQ 2/9  Decreased Interest 0 0 0  Down, Depressed, Hopeless 0 0 0  PHQ - 2 Score 0 0 0  Altered sleeping 0  0  Tired, decreased energy 1  0  Change in appetite 0  0  Feeling bad or failure about yourself  0  0  Trouble concentrating 0  0  Moving slowly or fidgety/restless 0  0  Suicidal thoughts 0  0  PHQ-9 Score 1  0  Difficult doing work/chores Not difficult at all  Not difficult at all    BP Readings from Last 3 Encounters:  09/02/23 112/72  06/29/23 (!)  125/91  06/21/23 128/88    Physical Exam Constitutional:      Appearance: Normal appearance.  Cardiovascular:     Rate and Rhythm: Normal rate and regular rhythm.  Pulmonary:     Breath sounds: No wheezing or rhonchi.  Musculoskeletal:     Cervical back: Normal range of motion.     Right lower leg: No edema.  Left lower leg: No edema.  Lymphadenopathy:     Cervical: No cervical adenopathy.  Skin:    General: Skin is warm and dry.     Findings: Erythema (blotchy red macule on left neck) present.  Neurological:     Mental Status: She is alert.     Wt Readings from Last 3 Encounters:  09/02/23 179 lb (81.2 kg)  06/29/23 174 lb (78.9 kg)  06/18/23 199 lb (90.3 kg)    BP 112/72 (BP Location: Left Arm, Cuff Size: Normal)   Pulse 95   Ht 5' 5 (1.651 m)   Wt 179 lb (81.2 kg)   SpO2 97%   Breastfeeding No   BMI 29.79 kg/m   Assessment and Plan:  Problem List Items Addressed This Visit   None Visit Diagnoses       Facial flushing    -  Primary   reduce bathing temperature pt reassured this is not from HTN       No follow-ups on file.    Leita HILARIO Adie, MD [facility] [place]

## 2023-09-09 DIAGNOSIS — F411 Generalized anxiety disorder: Secondary | ICD-10-CM | POA: Diagnosis not present

## 2023-09-18 DIAGNOSIS — F411 Generalized anxiety disorder: Secondary | ICD-10-CM | POA: Diagnosis not present

## 2023-09-18 DIAGNOSIS — F900 Attention-deficit hyperactivity disorder, predominantly inattentive type: Secondary | ICD-10-CM | POA: Diagnosis not present

## 2023-09-28 DIAGNOSIS — F411 Generalized anxiety disorder: Secondary | ICD-10-CM | POA: Diagnosis not present

## 2023-10-01 DIAGNOSIS — D225 Melanocytic nevi of trunk: Secondary | ICD-10-CM | POA: Diagnosis not present

## 2023-10-01 DIAGNOSIS — L578 Other skin changes due to chronic exposure to nonionizing radiation: Secondary | ICD-10-CM | POA: Diagnosis not present

## 2023-10-07 DIAGNOSIS — F411 Generalized anxiety disorder: Secondary | ICD-10-CM | POA: Diagnosis not present

## 2023-10-12 DIAGNOSIS — F411 Generalized anxiety disorder: Secondary | ICD-10-CM | POA: Diagnosis not present

## 2023-10-13 DIAGNOSIS — O24439 Gestational diabetes mellitus in the puerperium, unspecified control: Secondary | ICD-10-CM | POA: Diagnosis not present

## 2023-10-17 ENCOUNTER — Ambulatory Visit
Admission: EM | Admit: 2023-10-17 | Discharge: 2023-10-17 | Disposition: A | Discharge status: Home / Self Care | Attending: Family Medicine | Admitting: Family Medicine

## 2023-10-17 ENCOUNTER — Encounter: Payer: Self-pay | Admitting: Emergency Medicine

## 2023-10-17 DIAGNOSIS — R634 Abnormal weight loss: Secondary | ICD-10-CM | POA: Diagnosis not present

## 2023-10-17 DIAGNOSIS — Z79899 Other long term (current) drug therapy: Secondary | ICD-10-CM | POA: Diagnosis not present

## 2023-10-17 DIAGNOSIS — R630 Anorexia: Secondary | ICD-10-CM | POA: Insufficient documentation

## 2023-10-17 DIAGNOSIS — R197 Diarrhea, unspecified: Secondary | ICD-10-CM | POA: Insufficient documentation

## 2023-10-17 DIAGNOSIS — R112 Nausea with vomiting, unspecified: Secondary | ICD-10-CM | POA: Diagnosis not present

## 2023-10-17 DIAGNOSIS — F419 Anxiety disorder, unspecified: Secondary | ICD-10-CM | POA: Insufficient documentation

## 2023-10-17 DIAGNOSIS — Z6827 Body mass index (BMI) 27.0-27.9, adult: Secondary | ICD-10-CM | POA: Insufficient documentation

## 2023-10-17 LAB — CBC WITH DIFFERENTIAL/PLATELET
Abs Immature Granulocytes: 0.01 10*3/uL (ref 0.00–0.07)
Basophils Absolute: 0 10*3/uL (ref 0.0–0.1)
Basophils Relative: 1 %
Eosinophils Absolute: 0 10*3/uL (ref 0.0–0.5)
Eosinophils Relative: 0 %
HCT: 42.6 % (ref 36.0–46.0)
Hemoglobin: 14.4 g/dL (ref 12.0–15.0)
Immature Granulocytes: 0 %
Lymphocytes Relative: 24 %
Lymphs Abs: 1.3 10*3/uL (ref 0.7–4.0)
MCH: 26.4 pg (ref 26.0–34.0)
MCHC: 33.8 g/dL (ref 30.0–36.0)
MCV: 78.2 fL — ABNORMAL LOW (ref 80.0–100.0)
Monocytes Absolute: 0.4 10*3/uL (ref 0.1–1.0)
Monocytes Relative: 7 %
Neutro Abs: 3.7 10*3/uL (ref 1.7–7.7)
Neutrophils Relative %: 68 %
Platelets: 418 10*3/uL — ABNORMAL HIGH (ref 150–400)
RBC: 5.45 MIL/uL — ABNORMAL HIGH (ref 3.87–5.11)
RDW: 14.2 % (ref 11.5–15.5)
WBC: 5.4 10*3/uL (ref 4.0–10.5)
nRBC: 0 % (ref 0.0–0.2)

## 2023-10-17 LAB — GASTROINTESTINAL PANEL BY PCR, STOOL (REPLACES STOOL CULTURE)

## 2023-10-17 LAB — COMPREHENSIVE METABOLIC PANEL
ALT: 25 U/L (ref 0–44)
AST: 21 U/L (ref 15–41)
Albumin: 4.1 g/dL (ref 3.5–5.0)
Alkaline Phosphatase: 70 U/L (ref 38–126)
Anion gap: 11 (ref 5–15)
BUN: 11 mg/dL (ref 6–20)
CO2: 23 mmol/L (ref 22–32)
Calcium: 8.9 mg/dL (ref 8.9–10.3)
Chloride: 101 mmol/L (ref 98–111)
Creatinine, Ser: 0.87 mg/dL (ref 0.44–1.00)
GFR, Estimated: >60 mL/min (ref >60)
Glucose, Bld: 116 mg/dL — ABNORMAL HIGH (ref 70–99)
Potassium: 3.2 mmol/L — ABNORMAL LOW (ref 3.5–5.1)
Sodium: 135 mmol/L (ref 135–145)
Total Bilirubin: 0.3 mg/dL (ref 0.0–1.2)
Total Protein: 8.2 g/dL — ABNORMAL HIGH (ref 6.5–8.1)

## 2023-10-17 LAB — TSH: TSH: 0.772 u[IU]/mL (ref 0.350–4.500)

## 2023-10-17 MED ORDER — ONDANSETRON 4 MG PO TBDP
4.0000 mg | ORAL_TABLET | Freq: Three times a day (TID) | ORAL | 0 refills | Status: DC | PRN
Start: 2023-10-17 — End: 2024-01-05

## 2023-10-17 MED ORDER — OMEPRAZOLE 40 MG PO CPDR: 40.0000 mg | DELAYED_RELEASE_CAPSULE | Freq: Every day | ORAL | 1 refills | Status: DC

## 2023-10-17 NOTE — ED Provider Notes (Signed)
 [facility]    CSN: [id number] Arrival date & time: 10/17/23  6962      History   Chief Complaint Chief Complaint  Patient presents with   Anxiety   Anorexia   Emesis    HPI Evelyn Rangel is a 33 y.o. female.    Anxiety  Emesis Here for nausea and vomiting and diarrhea and anxiety.  Here for recurrent nausea and vomiting and now diarrhea. She delivered a baby in November of last year.  Since then she has lost a good bit of weight and has been having vomiting about 3 times weekly.  She has been having early satiety and decreased appetite.  Then about 5 or 6 days ago she started having more persistent vomiting and diarrhea.  The diarrhea was about 10 times yesterday.  She is actually not thrown up yesterday.  But she is still very nauseated.  No abdominal pain or cramping but sometimes her upper abdomen feels hard.  Uncertain about blood in the stool or emesis.  No fever or cough or congestion with this illness.  She does note some anxiety and some stressful situations in her family recently.  She denies any homicidal or suicidal thoughts.  She has been on Zoloft for some time now.  It does not sound like she has any true anhedonia at this time.  She saw her primary care in early February, about 2 months after delivery.  No testing was done at that time.  She is allergic to erythromycin.   Past Medical History:  Diagnosis Date   Allergy    seasonal   Anxiety    Asthma    years since attack, allergy based    Patient Active Problem List   Diagnosis Date Noted   Preterm premature rupture of membranes in third trimester, unspecified duration to onset of labor 06/18/2023   Obesity affecting pregnancy 06/18/2023   Anxiety 05/13/2023   History of infertility 05/13/2023   Insulin controlled gestational diabetes mellitus (GDM) during pregnancy, antepartum 01/13/2023   Supervision of high-risk pregnancy, third trimester 12/15/2022   Current moderate episode of  major depressive disorder without prior episode (HCC) 10/13/2019   Mild intermittent asthma without complication 06/14/2019   Prediabetes 06/14/2019   Vitamin D deficiency 06/14/2019   Vitamin B12 deficiency 06/14/2019   Tobacco use disorder 06/14/2019   Impingement syndrome of left shoulder 06/14/2019    Past Surgical History:  Procedure Laterality Date   ACHILLES TENDON SURGERY Left 11/07/2021   Procedure: ACHILLES TENDON REPAIR;  Surgeon: Rosetta Posner, DPM;  Location: [facility];  Service: Podiatry;  Laterality: Left;   CESAREAN SECTION  06/18/2023   Procedure: CESAREAN SECTION;  Surgeon: Christeen Douglas, MD;  Location: [facility];  Service: Obstetrics;;   INCISION AND DRAINAGE Left 05/15/2022   Procedure: INCISION AND DRAINAGE;  Surgeon: Rosetta Posner, DPM;  Location: [facility];  Service: Podiatry;  Laterality: Left;   lymph node removal     TONSILECTOMY/ADENOIDECTOMY WITH MYRINGOTOMY  2012   TONSILLECTOMY     TYMPANOSTOMY TUBE PLACEMENT     WISDOM TOOTH EXTRACTION  2009    OB History     Gravida  1   Para  1   Term      Preterm  1   AB      Living  1      SAB      IAB      Ectopic      Multiple  0  Live Births  1            Home Medications    Prior to Admission medications   Medication Sig Start Date End Date Taking? Authorizing Provider  omeprazole (PRILOSEC) 40 MG capsule Take 1 capsule (40 mg total) by mouth daily. 10/17/23  Yes Zenia Resides, MD  ondansetron (ZOFRAN-ODT) 4 MG disintegrating tablet Take 1 tablet (4 mg total) by mouth every 8 (eight) hours as needed for nausea or vomiting. 10/17/23  Yes Zenia Resides, MD  sertraline (ZOLOFT) 100 MG tablet Take 100 mg by mouth at bedtime. Dr. Billy Coast   Yes [provider]  acetaminophen (TYLENOL) 500 MG tablet Take 2 tablets (1,000 mg total) by mouth every 6 (six) hours as needed for mild pain (pain score 1-3) or fever. 06/21/23   Janyce Llanos,  CNM  albuterol (VENTOLIN HFA) 108 (90 Base) MCG/ACT inhaler Inhale 2 puffs into the lungs every 6 (six) hours as needed for wheezing or shortness of breath. 06/06/22   Reubin Milan, MD    Family History Family History  Problem Relation Age of Onset   Depression Mother    Mental illness Mother    Thyroid disease Mother    Depression Father    Mental illness Father     Social History Social History   Tobacco Use   Smoking status: Former    Current packs/day: 0.00    Average packs/day: 0.5 packs/day for 7.0 years (3.5 ttl pk-yrs)    Types: Cigarettes    Start date: 06/19/2014    Quit date: 06/19/2021    Years since quitting: 2.3   Smokeless tobacco: Never  Vaping Use   Vaping status: Former  Substance Use Topics   Alcohol use: Not Currently    Alcohol/week: 1.0 standard drink of alcohol    Types: 1 Standard drinks or equivalent per week   Drug use: Not Currently    Types: Marijuana     Allergies   Erythromycin   Review of Systems Review of Systems  Gastrointestinal:  Positive for vomiting.     Physical Exam Triage Vital Signs ED Triage Vitals  Encounter Vitals Group     BP 10/17/23 0924 117/85     Systolic BP Percentile --      Diastolic BP Percentile --      Pulse Rate 10/17/23 0924 (!) 103     Resp 10/17/23 0924 16     Temp 10/17/23 0924 98.4 F (36.9 C)     Temp Source 10/17/23 0924 Oral     SpO2 10/17/23 0924 96 %     Weight 10/17/23 0921 167 lb (75.8 kg)     Height 10/17/23 0921 5\' 5"  (1.651 m)     Head Circumference --      Peak Flow --      Pain Score 10/17/23 0920 0     Pain Loc --      Pain Education --      Exclude from Growth Chart --    No data found.  Updated Vital Signs BP 117/85 (BP Location: Right Arm)   Pulse (!) 103   Temp 98.4 F (36.9 C) (Oral)   Resp 16   Ht 5\' 5"  (1.651 m)   Wt 75.8 kg   SpO2 96%   Breastfeeding No   BMI 27.79 kg/m   Visual Acuity Right Eye Distance:   Left Eye Distance:   Bilateral  Distance:    Right Eye Near:  Left Eye Near:    Bilateral Near:     Physical Exam Vitals reviewed.  Constitutional:      General: She is not in acute distress.    Appearance: She is not ill-appearing, toxic-appearing or diaphoretic.     Comments: She is tearful at times during the interview.  She is able to smile and laugh about things also.  HENT:     Nose: Nose normal.     Mouth/Throat:     Mouth: Mucous membranes are moist.     Pharynx: No oropharyngeal exudate or posterior oropharyngeal erythema.  Eyes:     Extraocular Movements: Extraocular movements intact.     Conjunctiva/sclera: Conjunctivae normal.     Pupils: Pupils are equal, round, and reactive to light.  Cardiovascular:     Rate and Rhythm: Normal rate and regular rhythm.     Heart sounds: No murmur heard. Pulmonary:     Effort: Pulmonary effort is normal. No respiratory distress.     Breath sounds: No stridor. No wheezing, rhonchi or rales.  Abdominal:     General: Bowel sounds are normal. There is no distension.     Palpations: Abdomen is soft. There is no mass.     Tenderness: There is no abdominal tenderness. There is no guarding.  Musculoskeletal:     Cervical back: Neck supple.     Right lower leg: No edema.     Left lower leg: No edema.  Lymphadenopathy:     Cervical: No cervical adenopathy.  Skin:    Capillary Refill: Capillary refill takes less than 2 seconds.     Coloration: Skin is not jaundiced or pale.  Neurological:     General: No focal deficit present.     Mental Status: She is alert and oriented to person, place, and time.  Psychiatric:        Behavior: Behavior normal.      UC Treatments / Results  Labs (all labs ordered are listed, but only abnormal results are displayed) Labs Reviewed  CBC WITH DIFFERENTIAL/PLATELET - Abnormal; Notable for the following components:      Result Value   RBC 5.45 (*)    MCV 78.2 (*)    Platelets 418 (*)    All other components within normal  limits  GASTROINTESTINAL PANEL BY PCR, STOOL (REPLACES STOOL CULTURE)  COMPREHENSIVE METABOLIC PANEL  TSH    EKG   Radiology No results found.  Procedures Procedures (including critical care time)  Medications Ordered in UC Medications - No data to display  Initial Impression / Assessment and Plan / UC Course  I have reviewed the triage vital signs and the nursing notes.  Pertinent labs & imaging results that were available during my care of the patient were reviewed by me and considered in my medical decision making (see chart for details).     I do wonder if she has a gastroenteritis on top of her other symptoms this week.  Zofran is sent in for the nausea.  CBC, CMP and TSH are drawn today.  Will notify her if anything significantly abnormal Stool studies are collected and sent  She will follow-up with her OB/GYN about these issues.  I have also given her contact information for the behavioral health urgent care in [place] and have asked her to go there if she is feeling worse with worse mood or more acute symptoms. Final Clinical Impressions(s) / UC Diagnoses   Final diagnoses:  Nausea vomiting and diarrhea  Anxiety  Loss of weight     Discharge Instructions      We have drawn blood to check your blood counts and electrolytes and kidney and liver function and thyroid function.  Our staff will call you if anything is significantly abnormal.  Please collect the stool specimen and bring it back to this clinic.  We are open 8 8 Monday through Friday and 8 for Saturday and Sunday.  Ondansetron dissolved in the mouth every 8 hours as needed for nausea or vomiting.  Take omeprazole 40 mg--1 capsule daily for stomach acid.   Please follow-up with your primary care or OB/GYN about these issues.  If your anxiety or mood is a lot worse, please consider going to the behavioral health urgent care in [place].     ED Prescriptions     Medication Sig Dispense  Auth. Provider   ondansetron (ZOFRAN-ODT) 4 MG disintegrating tablet Take 1 tablet (4 mg total) by mouth every 8 (eight) hours as needed for nausea or vomiting. 20 tablet Zenia Resides, MD   omeprazole (PRILOSEC) 40 MG capsule Take 1 capsule (40 mg total) by mouth daily. 30 capsule Zenia Resides, MD      PDMP not reviewed this encounter.   Zenia Resides, MD 10/17/23 1016

## 2023-10-17 NOTE — ED Triage Notes (Addendum)
 Pt c/o loss of appetite, vomiting, nausea, diarrhea. Pt states she gave birth back in December. She has had these symptoms since giving birth but symptoms are getting worse. She had gestational DM but has gone away post partum. She has seen her PCP. She is not breast feeding. Pt denies suicidal thoughts. She states she has a "hard feeling" in her upper abdomen. Pt states she is taking Zoloft for anxiety. Pt is tearful during triage.

## 2023-10-17 NOTE — Discharge Instructions (Addendum)
 We have drawn blood to check your blood counts and electrolytes and kidney and liver function and thyroid function.  Our staff will call you if anything is significantly abnormal.  Please collect the stool specimen and bring it back to this clinic.  We are open 8 8 Monday through Friday and 8 for Saturday and Sunday.  Ondansetron dissolved in the mouth every 8 hours as needed for nausea or vomiting.  Take omeprazole 40 mg--1 capsule daily for stomach acid.   Please follow-up with your primary care or OB/GYN about these issues.  If your anxiety or mood is a lot worse, please consider going to the behavioral health urgent care in [place].

## 2023-10-20 ENCOUNTER — Telehealth (HOSPITAL_COMMUNITY): Payer: Self-pay

## 2023-10-20 ENCOUNTER — Telehealth: Payer: Self-pay | Admitting: Internal Medicine

## 2023-10-20 DIAGNOSIS — M79671 Pain in right foot: Secondary | ICD-10-CM | POA: Diagnosis not present

## 2023-10-20 DIAGNOSIS — M722 Plantar fascial fibromatosis: Secondary | ICD-10-CM | POA: Diagnosis not present

## 2023-10-20 DIAGNOSIS — M7731 Calcaneal spur, right foot: Secondary | ICD-10-CM | POA: Diagnosis not present

## 2023-10-20 DIAGNOSIS — M7661 Achilles tendinitis, right leg: Secondary | ICD-10-CM | POA: Diagnosis not present

## 2023-10-20 MED ORDER — POTASSIUM CHLORIDE ER 10 MEQ PO TBCR: 10.0000 meq | EXTENDED_RELEASE_TABLET | Freq: Two times a day (BID) | ORAL | 0 refills | Status: DC

## 2023-10-20 NOTE — Telephone Encounter (Signed)
 Please review

## 2023-10-20 NOTE — Telephone Encounter (Signed)
 Spoke with patient regarding her recent call for referral. Informed her of Dr. Karn Cassis instructions. Patient verbalized understanding.

## 2023-10-20 NOTE — Telephone Encounter (Signed)
 Copied from CRM [id number]. Topic: General - Other >> Oct 20, 2023 12:49 PM Emylou G wrote: Reason for CRM: Patient would like referral for gastroenterologist.. she had a visit 2/5.Marland Kitchen explained she was getting sick.Marland Kitchen tried to go to [facility] they said to go back to PCP.Marland Kitchen Pls call her [phone]

## 2023-10-20 NOTE — Telephone Encounter (Signed)
 Per P. Marlinda Mike, MD, "Potassium is low.  I think it would be good for her to take potassium chloride or Klor-Con 10 mill equivalents-#10 and 1 twice daily for 5 days.   No anemia   Kidney and liver function were normal.   Thyroid functions normal   Her GI panel shows astrovirus which apparently is a common viral cause of gastroenteritis.  I do think that made her feel worse in this last week, but she has some chronic troubles that she still needs to follow-up with her primary care or OB/GYN about."  Attempted to reach patient x1. LVM.  Rx sent to pharmacy on file.

## 2023-10-21 DIAGNOSIS — F411 Generalized anxiety disorder: Secondary | ICD-10-CM | POA: Diagnosis not present

## 2023-10-27 DIAGNOSIS — F411 Generalized anxiety disorder: Secondary | ICD-10-CM | POA: Diagnosis not present

## 2023-11-02 DIAGNOSIS — F411 Generalized anxiety disorder: Secondary | ICD-10-CM | POA: Diagnosis not present

## 2023-11-17 DIAGNOSIS — F411 Generalized anxiety disorder: Secondary | ICD-10-CM | POA: Diagnosis not present

## 2023-11-23 DIAGNOSIS — M5441 Lumbago with sciatica, right side: Secondary | ICD-10-CM | POA: Diagnosis not present

## 2023-11-23 DIAGNOSIS — G8929 Other chronic pain: Secondary | ICD-10-CM | POA: Diagnosis not present

## 2023-11-27 ENCOUNTER — Other Ambulatory Visit: Payer: Self-pay | Admitting: Orthopedic Surgery

## 2023-11-27 DIAGNOSIS — M5441 Lumbago with sciatica, right side: Secondary | ICD-10-CM

## 2023-11-30 ENCOUNTER — Encounter: Payer: Self-pay | Admitting: Orthopedic Surgery

## 2023-12-02 ENCOUNTER — Ambulatory Visit
Admission: RE | Admit: 2023-12-02 | Discharge: 2023-12-02 | Disposition: A | Discharge status: Home / Self Care | Source: Ambulatory Visit | Attending: Orthopedic Surgery | Admitting: Orthopedic Surgery

## 2023-12-02 DIAGNOSIS — G8929 Other chronic pain: Secondary | ICD-10-CM

## 2024-01-05 ENCOUNTER — Ambulatory Visit
Admission: EM | Admit: 2024-01-05 | Discharge: 2024-01-05 | Disposition: A | Discharge status: Home / Self Care | Attending: Emergency Medicine | Admitting: Emergency Medicine

## 2024-01-05 DIAGNOSIS — S0532XA Ocular laceration without prolapse or loss of intraocular tissue, left eye, initial encounter: Secondary | ICD-10-CM

## 2024-01-05 MED ORDER — MOXIFLOXACIN HCL 0.5 % OP SOLN: 1.0000 [drp] | Freq: Four times a day (QID) | OPHTHALMIC | 0 refills | Status: AC

## 2024-01-05 MED ORDER — IBUPROFEN 600 MG PO TABS: 600.0000 mg | ORAL_TABLET | Freq: Three times a day (TID) | ORAL | 0 refills | Status: AC | PRN

## 2024-01-05 NOTE — ED Triage Notes (Signed)
 Pt c/o L eye pain & swelling d/t being scratched by daughter today.

## 2024-01-05 NOTE — Discharge Instructions (Signed)
 Most likely have a laceration to the conjunctiva.  I spoke with Dr. Merrell Abate, ophthalmologist on-call, and I am starting you on Vigamox 4 times a day.  He would like for you to follow-up with [facility] in [place] in 1 to 2 days.  Cool compresses, 1000 mg of Tylenol  combined with 600 mg of ibuprofen  3-4 times a day.

## 2024-01-05 NOTE — ED Provider Notes (Signed)
 HPI  SUBJECTIVE:  Evelyn Rangel is a 33 y.o. female who presents with left lateral eye pain after being scratched by her 80-month-old daughter's fingernails 2 hours prior to evaluation.  She states she felt something "pop".  She reports photophobia, blurry vision, increased tearing, foreign body sensation laterally.  She reports pain with EOMs.  No pain with blinking.  She wears glasses, but not contacts.  She has not tried anything for this.  No alleviating factors.  Symptoms are worse with light.  She has no past medical history.  Her tetanus is up-to-date.  LMP: Now.  Denies the possibility of being pregnant.  [facility]   Past Medical History:  Diagnosis Date   Allergy    seasonal   Anxiety    Asthma    years since attack, allergy based    Past Surgical History:  Procedure Laterality Date   ACHILLES TENDON SURGERY Left 11/07/2021   Procedure: ACHILLES TENDON REPAIR;  Surgeon: Pink Bridges, DPM;  Location: [facility];  Service: Podiatry;  Laterality: Left;   CESAREAN SECTION  06/18/2023   Procedure: CESAREAN SECTION;  Surgeon: Prescilla Brod, MD;  Location: [facility];  Service: Obstetrics;;   INCISION AND DRAINAGE Left 05/15/2022   Procedure: INCISION AND DRAINAGE;  Surgeon: Pink Bridges, DPM;  Location: [facility];  Service: Podiatry;  Laterality: Left;   lymph node removal     TONSILECTOMY/ADENOIDECTOMY WITH MYRINGOTOMY  2012   TONSILLECTOMY     TYMPANOSTOMY TUBE PLACEMENT     WISDOM TOOTH EXTRACTION  2009    Family History  Problem Relation Age of Onset   Depression Mother    Mental illness Mother    Thyroid  disease Mother    Depression Father    Mental illness Father     Social History   Tobacco Use   Smoking status: Former    Current packs/day: 0.00    Average packs/day: 0.5 packs/day for 7.0 years (3.5 ttl pk-yrs)    Types: Cigarettes    Start date: 06/19/2014    Quit date: 06/19/2021    Years since quitting: 2.5   Smokeless  tobacco: Never  Vaping Use   Vaping status: Former  Substance Use Topics   Alcohol use: Not Currently    Alcohol/week: 1.0 standard drink of alcohol    Types: 1 Standard drinks or equivalent per week   Drug use: Not Currently    Types: Marijuana    No current facility-administered medications for this encounter.  Current Outpatient Medications:    albuterol  (VENTOLIN  HFA) 108 (90 Base) MCG/ACT inhaler, Inhale 2 puffs into the lungs every 6 (six) hours as needed for wheezing or shortness of breath., Disp: 18 g, Rfl: 1   ibuprofen  (ADVIL ) 600 MG tablet, Take 1 tablet (600 mg total) by mouth every 8 (eight) hours as needed., Disp: 30 tablet, Rfl: 0   moxifloxacin (VIGAMOX) 0.5 % ophthalmic solution, Place 1 drop into the left eye in the morning, at noon, in the evening, and at bedtime., Disp: 3 mL, Rfl: 0   sertraline  (ZOLOFT ) 100 MG tablet, Take 100 mg by mouth at bedtime. Dr. Court Distance, Disp: , Rfl:    tiZANidine (ZANAFLEX) 2 MG tablet, Take 1 tablet (2 mg total) by mouth at bedtime as needed, Disp: , Rfl:    acetaminophen  (TYLENOL ) 500 MG tablet, Take 2 tablets (1,000 mg total) by mouth every 6 (six) hours as needed for mild pain (pain score 1-3) or fever., Disp: 30 tablet, Rfl: 0  Allergies  Allergen Reactions   Erythromycin Hives     ROS  As noted in HPI.   Physical Exam  BP 107/76 (BP Location: Left Arm)   Pulse 65   Temp 98.5 F (36.9 C) (Oral)   Resp 16   Ht 5\' 5"  (1.651 m)   Wt 74.8 kg   LMP 01/04/2024 (Exact Date)   SpO2 100%   Breastfeeding No   BMI 27.46 kg/m   Constitutional: Well developed, well nourished, no acute distress Eyes:  EOMI, PERRLA, positive direct photophobia of left eye.  No consensual photophobia.  Positive left conjunctival injection.  Positive chemosis along the lateral and inferior part of the eye.  Possible laceration to the conjunctiva seen at the 3 o'clock position.  No foreign body seen on lid eversion.  Negative Seidel.  No corneal  abrasion.  No hyphema.    Visual Acuity  Right Eye Distance: 20/30 Left Eye Distance: 20/30 Bilateral Distance: 20/40 (w/correction)  Right Eye Near:   Left Eye Near:    Bilateral Near:        HENT: Normocephalic, atraumatic,mucus membranes moist Respiratory: Normal inspiratory effort Cardiovascular: Normal rate GI: nondistended skin: No rash, skin intact Musculoskeletal: no deformities Neurologic: Alert & oriented x 3, no focal neuro deficits Psychiatric: Speech and behavior appropriate   ED Course   Medications - No data to display  Orders Placed This Encounter  Procedures   Visual acuity screening    Standing Status:   Standing    Number of Occurrences:   1    No results found for this or any previous visit (from the past 24 hours). No results found.  ED Clinical Impression  1. Laceration of left conjunctiva, initial encounter      ED Assessment/Plan     Patient presents with a likely conjunctival laceration to the left side.  Tetracaine completely relieved her symptoms.  I do not appreciate any corneal abrasion, hyphema or globe rupture.   Discussed with Dr. Merrell Abate, ophthalmology on-call.  He advises Vigamox 1 to 2 drops 4 times daily for conjunctival laceration and follow-up with [facility] in [place] in 1 to 2 days.  He agrees with cold compresses and Tylenol /ibuprofen .  Discussed MDM, treatment plan, and plan for follow-up with patient. Discussed sn/sx that should prompt return to the ED. patient agrees with plan.   Meds ordered this encounter  Medications   moxifloxacin (VIGAMOX) 0.5 % ophthalmic solution    Sig: Place 1 drop into the left eye in the morning, at noon, in the evening, and at bedtime.    Dispense:  3 mL    Refill:  0   ibuprofen  (ADVIL ) 600 MG tablet    Sig: Take 1 tablet (600 mg total) by mouth every 8 (eight) hours as needed.    Dispense:  30 tablet    Refill:  0      *This clinic note was created using Administrator, sports. Therefore, there may be occasional mistakes despite careful proofreading.  ?    Ethlyn Herd, MD 01/05/24 2015

## 2024-01-20 ENCOUNTER — Other Ambulatory Visit: Payer: Self-pay | Admitting: Gastroenterology

## 2024-01-20 DIAGNOSIS — R10811 Right upper quadrant abdominal tenderness: Secondary | ICD-10-CM

## 2024-01-20 DIAGNOSIS — R110 Nausea: Secondary | ICD-10-CM

## 2024-02-05 ENCOUNTER — Encounter
